# Patient Record
Sex: Male | Born: 1985 | Race: White | Hispanic: No | Marital: Married | Smoking: Never smoker
Health system: Southern US, Community
[De-identification: ages and names within clinical notes are randomized; demographics above are authoritative.]

## PROBLEM LIST (undated history)

## (undated) ENCOUNTER — Emergency Department (HOSPITAL_COMMUNITY): Discharge status: Against Medical Advice | Payer: 59

## (undated) DIAGNOSIS — I10 Essential (primary) hypertension: Secondary | ICD-10-CM

## (undated) DIAGNOSIS — J302 Other seasonal allergic rhinitis: Secondary | ICD-10-CM

## (undated) DIAGNOSIS — IMO0002 Reserved for concepts with insufficient information to code with codable children: Secondary | ICD-10-CM

## (undated) DIAGNOSIS — M329 Systemic lupus erythematosus, unspecified: Secondary | ICD-10-CM

## (undated) DIAGNOSIS — M1990 Unspecified osteoarthritis, unspecified site: Secondary | ICD-10-CM

## (undated) HISTORY — DX: Other seasonal allergic rhinitis: J30.2

## (undated) HISTORY — PX: MOUTH SURGERY: SHX715

## (undated) HISTORY — DX: Reserved for concepts with insufficient information to code with codable children: IMO0002

## (undated) HISTORY — DX: Systemic lupus erythematosus, unspecified: M32.9

---

## 2008-08-12 DIAGNOSIS — D239 Other benign neoplasm of skin, unspecified: Secondary | ICD-10-CM

## 2008-08-12 HISTORY — DX: Other benign neoplasm of skin, unspecified: D23.9

## 2010-07-08 ENCOUNTER — Encounter: Payer: Self-pay | Admitting: Sports Medicine

## 2010-07-14 ENCOUNTER — Ambulatory Visit: Payer: Self-pay | Admitting: Sports Medicine

## 2010-07-14 ENCOUNTER — Encounter (INDEPENDENT_AMBULATORY_CARE_PROVIDER_SITE_OTHER): Payer: Self-pay | Admitting: *Deleted

## 2010-07-14 DIAGNOSIS — M25569 Pain in unspecified knee: Secondary | ICD-10-CM | POA: Insufficient documentation

## 2010-08-16 ENCOUNTER — Ambulatory Visit: Payer: Self-pay | Admitting: Sports Medicine

## 2010-08-16 DIAGNOSIS — M2140 Flat foot [pes planus] (acquired), unspecified foot: Secondary | ICD-10-CM | POA: Insufficient documentation

## 2010-08-16 DIAGNOSIS — R269 Unspecified abnormalities of gait and mobility: Secondary | ICD-10-CM | POA: Insufficient documentation

## 2010-09-16 ENCOUNTER — Ambulatory Visit: Payer: Self-pay | Admitting: Sports Medicine

## 2010-09-16 ENCOUNTER — Ambulatory Visit: Payer: Self-pay | Admitting: Family Medicine

## 2010-09-16 LAB — CONVERTED CEMR LAB
Anti Nuclear Antibody(ANA): NEGATIVE
Platelets: 221 10*3/uL (ref 150–400)
Rhuematoid fact SerPl-aCnc: <20 intl units/mL (ref 0–20)
WBC: 5.9 10*3/uL (ref 4.0–10.5)

## 2010-09-19 ENCOUNTER — Encounter: Payer: Self-pay | Admitting: Sports Medicine

## 2010-10-14 ENCOUNTER — Ambulatory Visit: Payer: Self-pay | Admitting: Sports Medicine

## 2010-10-14 DIAGNOSIS — G90529 Complex regional pain syndrome I of unspecified lower limb: Secondary | ICD-10-CM | POA: Insufficient documentation

## 2010-12-14 NOTE — Assessment & Plan Note (Signed)
Summary: F/U [facility]   Vital Signs:  Patient profile:   25 year old male BP sitting:   108 / 71  Vitals Entered By: Lillia Pauls CMA (September 16, 2010 9:51 AM)  History of Present Illness: 24yo to office for f/u of knee pain & hamstring pain. Knees about 60% worse - recent vacation 2-weeks ago & was doing some increased walking about  1-3 miles a day.  By end of vacation pain so severe that could not walk & was using a wheelchair. Knees very painful to light touch. Was having swelling around patellar tendon and throughout whole knee. Significant improvement in swelling with bodyhelix strap. Denies any mechanical symptoms, denies instability. Denies any night-time pain, but is having pain at rest. Using tylenol as needed without much improvement. Still using NTG patches 1/4 patch on each knee - started 63-months ago, but does not seem to be helping. Trying knee exercises, unable to do wall squats & leg lifts with weight b/c too painful. No longer having any hamstring pain. No previous blood work done for arthritis work-up. Denies hx of tick bite.  Allergies: No Known Drug Allergies  Past History:  Past Medical History: Last updated: 07/14/2010 denies  Past Surgical History: Last updated: 07/14/2010 denies  Family History: Last updated: 07/14/2010 Family History High cholesterol Family History Hypertension  Social History: Last updated: 07/14/2010 Occupation:Commercial realtor Single Alcohol use-no Drug use-no Never Smoked  Review of Systems      See HPI  Physical Exam  General:  Well-developed,well-nourished,in no acute distress; alert,appropriate and cooperative throughout examination Msk:  KNEES:  NTG patch in place over PT tendons b/l.  Mild amount of soft tissue swelling noted around PTs b/l, no effusion.  Full ROM b/l, increased pain with terminal flexion. Extremely TTP over PT b/l, minimal TTP over inferior pole of patella & tibial tubercle b/l.  Seems  to be hypersensitive over PT, even with light touch. No joint line tenderness. No erythema or warmth. No ligamentous instability. Mild quad atrophy bilaterally.  HIP:  pelvis level, good strength b/l  FEET:  No gross leg length discrepancy.  Mild b/l pes planus, no sig transverse arch breakdown; RT foot shows shift at midfoot into significant pronation and ext rotation Pulses:  +2/4 DP & PT b/l Neurologic:  sensation intact to light touch.   Additional Exam:  MSK U/S - B/L Knees - normal quad tendon b/l, no effusion in suprapatellar pouch b/l.  B/l calcification & mild swelling of infrapatellar bursae b/l, unchanged from previous u/s 07/14/10.  Relatively normal appearing PT b/l, no signs of significant thickening, no signs of tear.  No increased doppler flow.  Images saved.   Impression & Recommendations:  Problem # 1:  KNEE PAIN, BILATERAL (ICD-719.46) Assessment Deteriorated - Not improving as expected, no significant findings of tendinopathy on MSK U/S and u/s relatively unchanged when compared to previous scan 07/14/10.  Since not improving, wonder if may have rheumatologic process vs RSD/complex regional pain syndrome.   - Will check CBC, ESR, ANA, RF - will contact him with results - D/c NTG patches b/c not helping - Start amitriptyline q hs, explained how to take medicaiton & common side effects - Cont to use body helix knee straps  - cont to remain as active as possible - f/u 20-month for re-evaluation  ADDENDUM: Blood work reviewed, normal CBC, ESR, ANA, RF.  Patient notified of normal results.  Suspect symptoms due to RSD/complex regional pain syndrome.  Cont with amitriptyline as  stated above.  f/u 5-month, encouraged to call with questions or concerns.  Complete Medication List: 1)  Amitriptyline Hcl 25 Mg Tabs (Amitriptyline hcl) .... Take 1 tab by mouth daily at bedtime  Patient Instructions: 1)  Stop using nitroglycerin patches. 2)  Start taking amitriptyline 1/2 tab at  bedtime, then increase to full tab in 1-week if not having side effects. 3)  You will get blood work done to look for any signs of arthritis.  We will contact you with results.   4)  Cont. to use knee straps. 5)  Follow up in 4-weeks. Prescriptions: AMITRIPTYLINE HCL 25 MG TABS (AMITRIPTYLINE HCL) take 1 tab by mouth daily at bedtime  #30 x 3   Entered and Authorized by:   Darene Lamer MD   Signed by:   Darene Lamer MD on 09/16/2010   Method used:   Print then Give to Patient   RxID:   [id number]    Orders Added: 1)  Est. Patient Level IV [56213]

## 2010-12-14 NOTE — Assessment & Plan Note (Signed)
Summary: F/U[facility]   Vital Signs:  Patient profile:   25 year old male Pulse rate:   76 / minute BP sitting:   105 / 71  (left arm)  Vitals Entered By: Rochele Pages RN (August 16, 2010 9:31 AM) CC: f/u bilat knee pain- hamstring pain   CC:  f/u bilat knee pain- hamstring pain.  History of Present Illness: Patient is here for follow up BL knee pain Hamstring: Pain much improved with patella banding. Will wear band with exercise but otherwise doing much better. Knee Pain: No longer with rest and normal to limited daily activities. However will experience pain on or near patella tendon insertion on tibea with running or running up stairs.  Notes some knee stiffness when standing after prolonged sitting.  Using nitropatches as directed.   He seems pleased with this much progress in short time.  Wants to get back to easy walking and maybe gradually some running.  Current Problems (verified): 1)  Pes Planus  (ICD-734) 2)  Knee Pain, Bilateral  (ICD-719.46)  Current Medications (verified): 1)  Nitroglycerin 0.2 Mg/hr Pt24 (Nitroglycerin) .... 1/4 Patch Daily On Each Knee  Allergies (verified): No Known Drug Allergies  Past History:  Past Medical History: Last updated: 07/14/2010 denies  Social History: Last updated: 07/14/2010 Occupation:Commercial realtor Single Alcohol use-no Drug use-no Never Smoked  Review of Systems  The patient denies anorexia, fever, weight gain, chest pain, syncope, and abdominal pain.    Physical Exam  General:  VS noted. Well young man in NAD Msk:  Knee: Inspection with no erythema or effusion. Palpation shows mild TTP over patellar tendon insertion on tibial tubercle b/l (R>L).  No warmth or joint line tenderness or condyle tenderness. Note this is much less than last visit  ROM normal in flexion and extension and lower leg rotation. Ligaments with solid consistent endpoints including ACL, PCL, LCL, MCL. Negative Mcmurray's and  provocative meniscal tests. Minimally painful patellar compression b/l, no apprehension. Quadriceps tendons unremarkable. Moderate L quad atrophy compared to R, but somewhat atrophied on R as well.  No gross leg length discrepancy.  Gait - does have some outtoeing of R foot with walking and running.  Spine - mild scoliosis with mild hypertrophy and L lower paraspinal muscles compared to R  Feet - mild b/l pes planus, no sig transverse arch breakdown; RT foot shows shift at midfoot into significant pronation and ext rotation this is present standing, walking and running  Hip - decent hip abd strength b/l    Impression & Recommendations:  Problem # 1:  KNEE PAIN, BILATERAL (ICD-719.46) Assessment Improved Improved with nitro patches, however still symptoms with running etc.  Pt meeting expectation for recovery given degree of inflimation noted at first visit.  Hamstring pain resolved.  Note some pain at patellar tendon insertion on tibia.  Think still some bursitis.  Plan to start phase 1 knee exercises.  Sheets and precautions given.  Pt understands plan. RTC 1 month, will re-scan then.  Problem # 2:  ABNORMALITY OF GAIT (ICD-781.2) Assessment: New  Right foot with out-toeing and flattened arch. This issue likely root of above issues.  Placed scaphoid arch support on insole orthotic on right foot.  In-toeing mildly improved with gait analysis after arch support.  Given instructions for inline walking and pigeon toe walking exercises. Will follow up in 1 month.    Orders: [facility] [phone])  Complete Medication List: 1)  Nitroglycerin 0.2 Mg/hr Pt24 (Nitroglycerin) .... 1/4 patch daily  on each knee

## 2010-12-14 NOTE — Consult Note (Signed)
Summary: [facility]   Imported By: Marily Memos 07/09/2010 11:09:07  _____________________________________________________________________  External Attachment:    Type:   Image     Comment:   External Document

## 2010-12-14 NOTE — Assessment & Plan Note (Signed)
Summary: NP BILATERAL KNEE PAIN/MJD   Vital Signs:  Patient profile:   25 year old male Height:      71 inches Weight:      155 pounds BMI:     21.70 BP sitting:   118 / 81  Vitals Entered By: Lillia Pauls CMA (July 14, 2010 8:43 AM)  History of Present Illness: 25 yo M new patient referred by PCP Marisue Ivan. Here for  ~ 2 years of b/l knee pain. In 1/10, did 41 mile run.  Had subsequent [place] of knees that showed "tendinopathy" of L knee and tendinosis of R knee.  Was told had calcification of L patellar tendon and had CSI in L knee 3-4 weeks after his run. Seeing Riley Lam and Solomon at [place] previously. Had PRP by [facility] in both patellar tendons, thinks he had 24 injectios into each knee!  Also had calcifications scraped off.  Tried PT for awhile, which made things worse. Was on mobic for long time, but recently stopped for concerns of GI/kidney issues. Started on flector patches, helped some, but is now out. MRI in 11/11 which showed "nothing to do." At this point, can only walk 1-1.5 miles without his knees killing him and also gets effusions.  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  When he gets pain, located anterior over patellar tendon and on medial aspect on hamstrings.  L worse than R.  No surgery as of yet. No instability.  Really no catching/locking.  Does have feeling of "tendon getting hard" and then he can't bend it. Pain with walking down stairs in the AM.  Pain with prolonged sitting on airplane and getting up.  Past History:  Past Medical History: denies  Past Surgical History: denies  Family History: Family History High cholesterol Family History Hypertension  Social History: Occupation:Commercial realtor Single Alcohol use-no Drug use-no Never Smoked Occupation:  employed Drug Use:  no Smoking Status:  never  Review of Systems  The patient denies anorexia, fever, weight loss, weight gain, vision loss, decreased  hearing, hoarseness, chest pain, syncope, dyspnea on exertion, peripheral edema, prolonged cough, headaches, hemoptysis, abdominal pain, melena, hematochezia, severe indigestion/heartburn, hematuria, incontinence, genital sores, muscle weakness, suspicious skin lesions, transient blindness, depression, unusual weight change, abnormal bleeding, enlarged lymph nodes, angioedema, breast masses, and testicular masses.    Physical Exam  General:  Well-developed,well-nourished,in no acute distress; alert,appropriate and cooperative throughout examination Head:  normocephalic and atraumatic.   Eyes:  vision grossly intact.   Neck:  supple.   Chest Wall:  no deformities.   Lungs:  normal respiratory effort.   Abdomen:  soft.   Msk:  Knee: Inspection with no erythema or effusion, does have b/l recurvatum L>R (5deg vs 3 deg) Palpation shows significant TTP over patellar tendon insertion on tibial tubercle b/l (L>R) and TTP b/l on distal medial HS. No warmth or joint line tenderness or condyle tenderness. ROM normal in flexion and extension and lower leg rotation. Ligaments with solid consistent endpoints including ACL, PCL, LCL, MCL. Negative Mcmurray's and provocative meniscal tests. Minimally painful patellar compression b/l, no apprehension. Quadriceps tendons unremarkable. Moderate L quad atrophy compared to R, but somewhat atrophied on R as well.  No gross leg length discrepancy.  Gait - does have some outtoeing of R foot with walking and running.  Spine - mild scoliosis with mild hypertrophy and L lower paraspinal muscles compared to R  Feet - mild b/l pes planus, no sig transverse arch breakdown  Hip - decent hip abd strength b/l   Extremities:  No clubbing, cyanosis, edema, or deformity noted with normal full range of motion of all joints.   Neurologic:  alert & oriented X3.     Knee Exam  Knee Exam:    Limited b/l knee MSK [place] - essentially nl b/l quad and patellar tendons.  Does  have b/l calcification and mild swelling of b/l infrapatellar bursae, which appears to be most tender point on his exam.  Doppler reveals minimal blood flow. Limited HS [place] - no e/o HS tear or tendinopathy   Impression & Recommendations:  Problem # 1:  KNEE PAIN, BILATERAL (ICD-719.46) Assessment Unchanged  At this point, most likely appears to have chronic b/l infrapatellar calcification and bursitis, may also have some component of chronic enthesiopathy of patellar tendon at insertion onto tibial tubercle.  Most likely his HS pain is hypersensitivity from decreased use over last 1-2 years and causes pain with excessive activity. - Will start NG patch protocol (1/4 patch daily) on b/l knees over distal patellar tendon - placed in b/l slingshot patellar tendon straps - resume quad strengthening exercises with SLR and quad sets - start light aerobic activity to start refiring of his HS (5-10 min of walking forward and back, or stationary bike) - f/u 1 month  Orders: [place] LIMITED ([id number]) Patella / Knee brace ([id number])  Complete Medication List: 1)  Nitroglycerin 0.2 Mg/hr Pt24 (Nitroglycerin) .... 1/4 patch daily on each knee Prescriptions: NITROGLYCERIN 0.2 MG/HR PT24 (NITROGLYCERIN) 1/4 patch daily on each knee  #45 x 0   Entered and Authorized by:   Corbin Ade MD   Signed by:   Corbin Ade MD on 07/14/2010   Method used:   Electronically to        [facility] [phone]* (retail)       [address] [place], [place]  [address]       Ph: [phone] or [phone]       Fax: [phone]   RxID:   [id number]

## 2010-12-14 NOTE — Assessment & Plan Note (Signed)
Summary: F/U [facility]   Vital Signs:  Patient profile:   25 year old male BP sitting:   114 / 79  Vitals Entered By: Lillia Pauls CMA (October 14, 2010 8:55 AM)  History of Present Illness: Knees feel about 90% better once or twice weekly feels some sharp pain but no resting pain no probs taking amitriptyline at night stopped NTG patches without any rebound of pain  during past month though was sick for 3 wks w pneumonia for this reason has not been that active  Allergies: No Known Drug Allergies  Physical Exam  General:  Well-developed,well-nourished,in no acute distress; alert,appropriate and cooperative throughout examination Msk:  knee exam shows no effusion; stable ligaments; negative Mcmurray's and provocative meniscal tests; non painful patellar compression; patellar and quadriceps tendons unremarkable.  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 Take 1 tab by mouth daily at bedtime  Patient Instructions: 1)  exercises 2)  straight leg raises 1 set of 15 3)  wall sits 5 x 30 secs 4)  walk as desired 5)  no vigorous activity 6)  keep on amitriptyline 7)  check in 2 months   Orders Added: 1)  Est. Patient Level III [51884]

## 2010-12-14 NOTE — Letter (Signed)
Summary: *Consult Note  [facility]  [address]   [place], [place] [address]   Phone: [phone]  Fax: [phone]    Re:    Eric Vance DOB:    12/11/1985   Dear: Dr. Burnadette Pop   Thank you for requesting that we see the above patient for consultation.  A copy of the detailed office note will be sent under separate cover, for your review.  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  Our recommendation is for:  nitroglycerine patches, b/l slingshot patellar tendon straps, quad strengthening exercises, and light aerobic activity.  He will f/u with [place] in 1 month.   New Orders include:  1)  Consultation Level II [99242] 2)  [place] LIMITED [[id number]] 3)  Patella / Knee brace [L2795]   New Medications started today include:  1)  NITROGLYCERIN 0.2 MG/HR PT24 (NITROGLYCERIN) 1/4 patch daily on each knee   After today's visit, the patients current medications include: 1)  NITROGLYCERIN 0.2 MG/HR PT24 (NITROGLYCERIN) 1/4 patch daily on each knee   Thank you for this consultation.  If you have any further questions regarding the care of this patient, please do not hesitate to contact me @ [phone]  Thank you for this opportunity to look after your patient.  Hope to see you soon!  Sincerely,   Corbin Ade MD Sports Medicine Fellow  Roanna Epley MD

## 2010-12-14 NOTE — Letter (Signed)
Summary: *Consult Note  [facility]  [address]   [place], [place] [address]   Phone: [phone]  Fax: [phone]    Re:    EMRAH ARIOLA DOB:    1986-08-22   Dear Dr. Burnadette Pop:    We had the pleasure of seeing your patient Chibuikem Thang in follow-up for bilateral knee pain.  A copy of the detailed office note will be sent under separate cover, for your review.  Evaluation today is consistent with:  1) KNEE PAIN, BILATERAL (ICD-719.46)  Knee pain is failing to improve as expected.  We previously thought pain was related to chronic infrapatellar bursitis with possible patellar enthesiopathy.  We decided to check blood work to evaluate for rheumatologic process, but all blood work was normal.  With normal labs, unchanged musculoskeleteal ultrasound, and persistent pain with hypersensitivity of knees we suspect RSD (reflex sympathetic dystrophy)/Complex regional pain syndrome as cause for his pain.  Our recommendation is for:  1) Blood work with CBC, ESR, ANA, RF - all were normal 2) Discontinue nitroglycerin patches 3) Start amitriptyline to help with symptoms 4) Continue knee straps 5) Follow-up with [place] in 14-month for re-evaluation.  New Orders include:  1)  Est. Patient Level IV [99214]  New Medications started today include:  1)  AMITRIPTYLINE HCL 25 MG TABS (AMITRIPTYLINE HCL) take 1 tab by mouth daily at bedtime  After today's visit, the patients current medications include: 1)  AMITRIPTYLINE HCL 25 MG TABS (AMITRIPTYLINE HCL) take 1 tab by mouth daily at bedtime  Thank you for allowing [place] to participate in the care of your patient.  If you have any further questions regarding the care of this patient, please do not hesitate to contact me @ [phone].  Thank you for this opportunity to look after your patient.  Sincerely,   Darene Lamer DO Sports Medicine Fellow  Roanna Epley, MD

## 2010-12-30 ENCOUNTER — Ambulatory Visit (INDEPENDENT_AMBULATORY_CARE_PROVIDER_SITE_OTHER): Payer: 59 | Admitting: Sports Medicine

## 2010-12-30 ENCOUNTER — Encounter: Payer: Self-pay | Admitting: Sports Medicine

## 2010-12-30 DIAGNOSIS — G90529 Complex regional pain syndrome I of unspecified lower limb: Secondary | ICD-10-CM

## 2011-01-05 NOTE — Assessment & Plan Note (Signed)
Summary: F/U FOR INJURY BOTH KNEES/LP   Vital Signs:  Patient profile:   25 year old male BP sitting:   97 / 85  Vitals Entered By: Lillia Pauls CMA (December 30, 2010 12:07 PM)  History of Present Illness: FU Bilat knee pain 2/2 RSD  Still gets knee sxs if he does too much also got worse sxs when he forgot to take the meds w him on a trip biggest side effect is some grogginess or fogginess in AM and sometimes through day Pain in day - none w usual activity or no more than 2/10 walked up steep hill though and in 100 yds had swelling pain - pain 5/10  able to do work activities w minimal problems   Allergies: No Known Drug Allergies  Physical Exam  General:  Well-developed,well-nourished,in no acute distress; alert,appropriate and cooperative throughout examination Msk:  Bilateral knee exam shows no effusion; stable ligaments; negative Mcmurray's and provocative meniscal tests; non painful patellar compression; patellar and quadriceps tendons unremarkable.  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 2 by mouth at bedtime as directed 2)  Tramadol Hcl 50 Mg Tabs (Tramadol hcl) .Marland Kitchen.. 1 by mouth bid  Patient Instructions: 1)  Tramadol 2)  start taking 1 in morning and one about 4 to 6PM 3)  side effects sometimes dizziness at first 4)  rare nausea 5)  late at night - can wake you up 6)  amitriptyline 7)  wait until you have had 3 days of tramadol 8)  then try 2 at night 9)  monitor sxs 10)  If workign - see me in 6 wks Prescriptions: AMITRIPTYLINE HCL 25 MG TABS (AMITRIPTYLINE HCL) 2 by mouth at bedtime as  directed  #60 x 5   Entered and Authorized by:   Enid Baas MD   Signed by:   Enid Baas MD on 12/30/2010   Method used:   [facility] to        [facility] [phone]* (retail)       [address] [place], [place]  [address]       Ph: [phone] or [phone]       Fax: [phone]   RxID:   [id number] TRAMADOL HCL 50 MG TABS (TRAMADOL HCL) 1 by mouth bid  #60 x 3   Entered and Authorized by:   Enid Baas MD   Signed by:   Enid Baas MD on 12/30/2010   Method used:   [facility]ron[facility] to        [facility] [phone]* (retail)       [address] [place], [place]  [address]       Ph: [phone] or [phone]       Fax: [phone]   RxID:   [id number]    Orders Added: 1)  Est. Patient Level III [28315]

## 2011-08-04 ENCOUNTER — Other Ambulatory Visit: Payer: Self-pay | Admitting: *Deleted

## 2011-08-04 MED ORDER — AMITRIPTYLINE HCL 25 MG PO TABS: ORAL_TABLET | ORAL | Status: DC

## 2011-10-19 ENCOUNTER — Other Ambulatory Visit: Payer: Self-pay | Admitting: *Deleted

## 2011-10-19 MED ORDER — AMITRIPTYLINE HCL 25 MG PO TABS: ORAL_TABLET | ORAL | Status: DC

## 2011-10-25 ENCOUNTER — Encounter: Payer: Self-pay | Admitting: Sports Medicine

## 2011-10-25 ENCOUNTER — Ambulatory Visit (INDEPENDENT_AMBULATORY_CARE_PROVIDER_SITE_OTHER): Payer: 59 | Admitting: Sports Medicine

## 2011-10-25 VITALS — BP 110/60

## 2011-10-25 DIAGNOSIS — M25569 Pain in unspecified knee: Secondary | ICD-10-CM

## 2011-10-25 DIAGNOSIS — G90529 Complex regional pain syndrome I of unspecified lower limb: Secondary | ICD-10-CM

## 2011-10-25 MED ORDER — COLCHICINE 0.6 MG PO TABS: 0.6000 mg | ORAL_TABLET | Freq: Two times a day (BID) | ORAL | Status: DC

## 2011-10-25 MED ORDER — AMITRIPTYLINE HCL 25 MG PO TABS: 50.0000 mg | ORAL_TABLET | Freq: Every day | ORAL | Status: DC

## 2011-10-25 NOTE — Patient Instructions (Signed)
We will trial you on colchicine for one month. This may help with your knee symptoms.

## 2011-10-25 NOTE — Assessment & Plan Note (Addendum)
Refilled amitriptyline Trial of colchicine. F/u in one month  Do not wean amitriptyline

## 2011-10-25 NOTE — Progress Notes (Signed)
  Subjective:    Patient ID: Eric Vance, male    DOB: 18-Dec-1985, 25 y.o.   MRN: [id number]  HPI Eric Vance returns for f/u He was originally tx at [facility] Tendon injury to left patellar tendon p running marathon Dr [place]ty Court did multiple injections of PRP These did help pain initially but his never got better and gradually developed easy flares with activity Developed more stiffness  We treated him initially with NTG and some lessening of pain Patellar straps helped  However pain continued to flare and stiffness worse even w light activity Finally given tramadol and amitriptyline aitriptyline and tramadol resolved 90% of pain for 6 wks  Now continues to have minimal pain as long as he stays on amitriptyline However, if he is too active will have pain next day Left knee is stiffer w rain and for 1 to 2 days after activity  Activity more than 2 miles walking makes lt knee painful, warm and red   Review of Systems     Objective:   Physical Exam  NAD  Knee: Normal to inspection with no erythema or effusion or obvious bony abnormalities. Palpation normal with no warmth or joint line tenderness or patellar tenderness or condyle tenderness. ROM normal in flexion and extension and lower leg rotation. Ligaments with solid consistent endpoints including ACL, PCL, LCL, MCL. Negative Mcmurray's and provocative meniscal tests. Non painful patellar compression. Patellar and quadriceps tendons unremarkable. Hamstring and quadriceps strength is normal.      Assessment & Plan:

## 2011-10-25 NOTE — Assessment & Plan Note (Signed)
In case there is a chance of CPPD or other crystalline issue (hx of calcification noted at [facility]) we will give a trial of colchicine to see if that lessens the tendency toward inflammatory change w walking

## 2011-11-30 ENCOUNTER — Ambulatory Visit: Payer: 59 | Admitting: Sports Medicine

## 2011-12-21 ENCOUNTER — Ambulatory Visit: Payer: 59 | Admitting: Sports Medicine

## 2012-04-12 DIAGNOSIS — S86819A Strain of other muscle(s) and tendon(s) at lower leg level, unspecified leg, initial encounter: Secondary | ICD-10-CM | POA: Insufficient documentation

## 2012-06-13 ENCOUNTER — Encounter: Payer: Self-pay | Admitting: Family Medicine

## 2012-06-13 DIAGNOSIS — M7651 Patellar tendinitis, right knee: Secondary | ICD-10-CM | POA: Insufficient documentation

## 2014-08-07 DIAGNOSIS — M25561 Pain in right knee: Secondary | ICD-10-CM | POA: Insufficient documentation

## 2014-08-12 DIAGNOSIS — E559 Vitamin D deficiency, unspecified: Secondary | ICD-10-CM | POA: Insufficient documentation

## 2015-09-01 ENCOUNTER — Other Ambulatory Visit: Payer: Self-pay | Admitting: Orthopedic Surgery

## 2015-09-01 DIAGNOSIS — M79644 Pain in right finger(s): Secondary | ICD-10-CM

## 2015-09-14 ENCOUNTER — Ambulatory Visit
Admission: RE | Admit: 2015-09-14 | Discharge: 2015-09-14 | Disposition: A | Discharge status: Home / Self Care | Payer: BLUE CROSS/BLUE SHIELD | Source: Ambulatory Visit | Attending: Orthopedic Surgery | Admitting: Orthopedic Surgery

## 2015-09-14 DIAGNOSIS — M79644 Pain in right finger(s): Secondary | ICD-10-CM

## 2015-09-14 MED ORDER — IOHEXOL 180 MG/ML  SOLN
1.0000 mL | Freq: Once | INTRAMUSCULAR | Status: DC | PRN
  Administered 2015-09-14: 1 mL via INTRA_ARTICULAR

## 2015-11-27 DIAGNOSIS — S63410A Traumatic rupture of collateral ligament of right index finger at metacarpophalangeal and interphalangeal joint, initial encounter: Secondary | ICD-10-CM | POA: Insufficient documentation

## 2016-03-15 ENCOUNTER — Other Ambulatory Visit: Payer: Self-pay | Admitting: Family Medicine

## 2016-03-15 DIAGNOSIS — R51 Headache: Principal | ICD-10-CM

## 2016-03-15 DIAGNOSIS — R519 Headache, unspecified: Secondary | ICD-10-CM

## 2016-03-21 ENCOUNTER — Ambulatory Visit: Payer: BLUE CROSS/BLUE SHIELD

## 2016-06-28 ENCOUNTER — Other Ambulatory Visit: Payer: Self-pay | Admitting: Orthopedic Surgery

## 2016-06-28 DIAGNOSIS — M79641 Pain in right hand: Secondary | ICD-10-CM

## 2016-07-06 ENCOUNTER — Other Ambulatory Visit: Payer: BLUE CROSS/BLUE SHIELD

## 2017-08-02 ENCOUNTER — Other Ambulatory Visit: Payer: Self-pay | Admitting: Internal Medicine

## 2017-08-02 ENCOUNTER — Ambulatory Visit
Admission: RE | Admit: 2017-08-02 | Discharge: 2017-08-02 | Disposition: A | Discharge status: Home / Self Care | Payer: BLUE CROSS/BLUE SHIELD | Source: Ambulatory Visit | Attending: Internal Medicine | Admitting: Internal Medicine

## 2017-08-02 DIAGNOSIS — N62 Hypertrophy of breast: Secondary | ICD-10-CM | POA: Insufficient documentation

## 2017-08-02 DIAGNOSIS — R0789 Other chest pain: Secondary | ICD-10-CM

## 2017-08-02 DIAGNOSIS — R091 Pleurisy: Secondary | ICD-10-CM | POA: Diagnosis not present

## 2017-08-02 MED ORDER — IOPAMIDOL (ISOVUE-370) INJECTION 76%
100.0000 mL | Freq: Once | INTRAVENOUS | Status: AC | PRN
  Administered 2017-08-02: 100 mL via INTRAVENOUS

## 2018-01-31 DIAGNOSIS — M25541 Pain in joints of right hand: Secondary | ICD-10-CM | POA: Insufficient documentation

## 2018-04-06 ENCOUNTER — Encounter: Payer: Self-pay | Admitting: Urology

## 2018-04-06 ENCOUNTER — Ambulatory Visit (INDEPENDENT_AMBULATORY_CARE_PROVIDER_SITE_OTHER): Payer: BLUE CROSS/BLUE SHIELD | Admitting: Urology

## 2018-04-06 VITALS — BP 123/79 | HR 83 | Ht 70.0 in | Wt 157.2 lb

## 2018-04-06 DIAGNOSIS — N5089 Other specified disorders of the male genital organs: Secondary | ICD-10-CM

## 2018-04-06 DIAGNOSIS — N509 Disorder of male genital organs, unspecified: Secondary | ICD-10-CM | POA: Diagnosis not present

## 2018-04-06 DIAGNOSIS — N5082 Scrotal pain: Secondary | ICD-10-CM

## 2018-04-06 MED ORDER — ETODOLAC 300 MG PO CAPS: 300.0000 mg | ORAL_CAPSULE | Freq: Three times a day (TID) | ORAL | 0 refills | Status: DC | PRN

## 2018-04-06 NOTE — Progress Notes (Signed)
04/06/2018 3:31 PM   Eric Vance 05/27/1986 [id number]  Referring provider: No referring provider defined for this encounter.  Chief Complaint  Patient presents with  . Testicle Pain    HPI: 32 year old male presents for evaluation of right hemiscrotal pain.  Approximately 2 days ago he had onset of right groin pain radiating to the right scrotum.  He had sharp pain which was intermittent every 5 to 10 minutes which lasted 10 to 15 hours.  His sharp pain has resolved and he complains of a dull throbbing pain with mild to moderate severity.  He has no voiding symptoms.  Denies fever or chills.  His pain is worse when supine and better when upright.  He has a history of a right spermatocele previously aspirated by Dr. Yves Dill several years ago.  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   PMH: Past Medical History:  Diagnosis Date  . Lupus ([place])   . Seasonal allergies     Surgical History: History reviewed. No pertinent surgical history.  Home Medications:  Allergies as of 04/06/2018   No Known Allergies     Medication List        Accurate as of 04/06/18  3:31 PM. Always use your most recent med list.          acetaminophen 650 MG CR tablet Commonly known as:  TYLENOL Take by mouth.   FLONASE SENSIMIST 27.5 MCG/SPRAY nasal spray Generic drug:  fluticasone Place into the nose.   levocetirizine 5 MG tablet Commonly known as:  XYZAL Take by mouth.   montelukast 10 MG tablet Commonly known as:  SINGULAIR Take by mouth.       Allergies: No Known Allergies  Family History: Family History  Problem Relation Age of Onset  . Hypertension Father     Social History:  reports that he has never smoked. He has never used smokeless tobacco. He reports that he drinks alcohol. He reports that he does not use drugs.  ROS: UROLOGY Frequent Urination?: No Hard to postpone urination?: No Burning/pain with urination?: No Get  up at night to urinate?: No Leakage of urine?: No Urine stream starts and stops?: No Trouble starting stream?: No Do you have to strain to urinate?: No Blood in urine?: No Urinary tract infection?: No Sexually transmitted disease?: No Injury to kidneys or bladder?: No Painful intercourse?: No Weak stream?: No Erection problems?: No Penile pain?: No  Gastrointestinal Nausea?: No Vomiting?: No Indigestion/heartburn?: No Diarrhea?: No Constipation?: No  Constitutional Fever: No Night sweats?: No Weight loss?: No Fatigue?: No  Skin Skin rash/lesions?: No Itching?: No  Eyes Blurred vision?: No Double vision?: No  Ears/Nose/Throat Sore throat?: No Sinus problems?: No  Hematologic/Lymphatic Swollen glands?: No Easy bruising?: No  Cardiovascular Leg swelling?: No Chest pain?: No  Respiratory Cough?: No Shortness of breath?: No  Endocrine Excessive thirst?: No  Musculoskeletal Back pain?: Yes Joint pain?: Yes  Neurological Headaches?: No Dizziness?: No  Psychologic Depression?: No Anxiety?: No  Physical Exam: BP 123/79 (BP Location: Left Arm, Patient Position: Sitting, Cuff Size: Normal)   Pulse 83   Ht 5\' 10"  (1.778 m)   Wt 157 lb 3.2 oz (71.3 kg)   SpO2 99%   BMI 22.56 kg/m   Constitutional:  Alert and oriented, No acute distress. HEENT: [place] AT, moist mucus membranes.  Trachea midline, no masses. Cardiovascular: No clubbing, cyanosis, or edema. Respiratory: Normal respiratory effort, no increased work of  breathing. GI: Abdomen is soft, nontender, nondistended, no abdominal masses GU: No CVA tenderness penis without lesions.. Testes descended bilaterally without masses or tenderness.  The 15 mm right spermatocele is present and just superior and lateral is an approximately 2 cm soft mass.  A definite hernia is not appreciated. Lymph: No cervical or inguinal lymphadenopathy. Skin: No rashes, bruises or suspicious lesions. Neurologic: Grossly  intact, no focal deficits, moving all 4 extremities. Psychiatric: Normal mood and affect.  Laboratory Data: Lab Results  Component Value Date   WBC 5.9 09/16/2010   HGB 14.5 09/16/2010   HCT 43.6 09/16/2010   MCV 94.6 09/16/2010   PLT 221 09/16/2010    Assessment & Plan:   32 year old male with right hemiscrotal pain.  His spermatocele is unchanged however there is a new mass palpable in the right hemiscrotum.  This may be a cord lipoma, cord hydrocele or possible hernia.  I recommended a scrotal sonogram for further evaluation.  Rx etodolac was sent to his pharmacy.    Abbie Sons, [place] [address] [place], [place] [address] [phone]

## 2018-04-10 ENCOUNTER — Encounter: Payer: Self-pay | Admitting: Urology

## 2018-04-11 ENCOUNTER — Ambulatory Visit: Payer: Self-pay | Admitting: Urology

## 2018-04-13 ENCOUNTER — Ambulatory Visit
Admission: RE | Admit: 2018-04-13 | Discharge: 2018-04-13 | Disposition: A | Discharge status: Home / Self Care | Payer: BLUE CROSS/BLUE SHIELD | Source: Ambulatory Visit | Attending: Urology | Admitting: Urology

## 2018-04-13 ENCOUNTER — Telehealth: Payer: Self-pay | Admitting: Urology

## 2018-04-13 DIAGNOSIS — N5089 Other specified disorders of the male genital organs: Secondary | ICD-10-CM

## 2018-04-13 DIAGNOSIS — N5082 Scrotal pain: Secondary | ICD-10-CM

## 2018-04-13 NOTE — Telephone Encounter (Signed)
I spoke with [facility] and they do want the [place] ordered with doppler so just keep doing it the way you are ordering it and it should all be good.   Thanks,  Sharyn Lull

## 2018-04-13 NOTE — Telephone Encounter (Signed)
Patient has decided to go to [facility] to have his Ultrasound done and since we had already scheduled it for [place] I will need a new order for the Scrotal and doppler put in please. [place] could not use the other due to it already being attached to BTO.  Thanks, Sharyn Lull

## 2018-04-13 NOTE — Telephone Encounter (Signed)
The order was entered.

## 2018-04-18 ENCOUNTER — Ambulatory Visit
Admission: RE | Admit: 2018-04-18 | Discharge: 2018-04-18 | Disposition: A | Discharge status: Home / Self Care | Payer: BLUE CROSS/BLUE SHIELD | Source: Ambulatory Visit | Attending: Urology | Admitting: Urology

## 2018-04-18 DIAGNOSIS — N5089 Other specified disorders of the male genital organs: Secondary | ICD-10-CM

## 2018-04-18 DIAGNOSIS — N5082 Scrotal pain: Secondary | ICD-10-CM

## 2018-04-20 ENCOUNTER — Encounter: Payer: Self-pay | Admitting: Urology

## 2018-04-24 ENCOUNTER — Telehealth: Payer: Self-pay | Admitting: Urology

## 2018-04-24 NOTE — Telephone Encounter (Signed)
Pt called office requesting his [place] results.  Please Advise. Thanks.

## 2018-04-25 NOTE — Telephone Encounter (Signed)
He was given ultrasound results.  Please schedule possible spermatocele aspiration.  This can be in a 15-minute slot

## 2018-06-06 ENCOUNTER — Encounter: Payer: Self-pay | Admitting: Urology

## 2018-06-06 ENCOUNTER — Ambulatory Visit: Payer: BLUE CROSS/BLUE SHIELD | Admitting: Urology

## 2018-06-06 VITALS — BP 116/79 | HR 106 | Ht 70.0 in | Wt 152.9 lb

## 2018-06-06 DIAGNOSIS — N4340 Spermatocele of epididymis, unspecified: Secondary | ICD-10-CM | POA: Diagnosis not present

## 2018-06-06 NOTE — Progress Notes (Signed)
Procedure note:  Diagnosis: Spermatocele with pain localized to this area  Procedure: Aspiration of spermatocele  Anesthesia: 1% plain Xylocaine  Description: The external genitalia were prepped and draped in the usual fashion.  The spermatocele was elevated to the anterior skin surface and sonogram was performed which showed an anechoic structure consistent with fluid consistency.  The overlying skin was anesthetized with 1 mL of 1% plain Xylocaine.  The cyst was punctured with a 20-gauge Angiocath and 15 mL of straw-colored fluid was aspirated with resolution of the cyst.  The second smaller spermatocele was aspirated in a similar fashion.  Complications: None  Recommendation: Should he have relatively rapid reaccumulation of the cyst fluid he is interested in spermatocelectomy.  He will return as needed.  Call back for redness, swelling or increased pain.

## 2018-06-07 ENCOUNTER — Encounter: Payer: Self-pay | Admitting: Urology

## 2019-05-02 ENCOUNTER — Other Ambulatory Visit: Payer: Self-pay

## 2019-05-02 ENCOUNTER — Other Ambulatory Visit: Payer: Self-pay | Admitting: Family Medicine

## 2019-05-02 ENCOUNTER — Ambulatory Visit
Admission: RE | Admit: 2019-05-02 | Discharge: 2019-05-02 | Disposition: A | Discharge status: Home / Self Care | Payer: BC Managed Care – PPO | Source: Ambulatory Visit | Attending: Family Medicine | Admitting: Family Medicine

## 2019-05-02 DIAGNOSIS — M5441 Lumbago with sciatica, right side: Secondary | ICD-10-CM | POA: Diagnosis present

## 2019-05-02 DIAGNOSIS — M5442 Lumbago with sciatica, left side: Secondary | ICD-10-CM | POA: Insufficient documentation

## 2019-05-03 ENCOUNTER — Ambulatory Visit: Payer: BLUE CROSS/BLUE SHIELD

## 2019-08-01 ENCOUNTER — Ambulatory Visit: Payer: BC Managed Care – PPO | Admitting: Urology

## 2019-08-01 ENCOUNTER — Other Ambulatory Visit: Payer: Self-pay

## 2019-08-01 ENCOUNTER — Encounter: Payer: Self-pay | Admitting: Urology

## 2019-08-01 VITALS — BP 114/74 | HR 75 | Ht 71.0 in | Wt 159.2 lb

## 2019-08-01 DIAGNOSIS — N528 Other male erectile dysfunction: Secondary | ICD-10-CM | POA: Diagnosis not present

## 2019-08-01 DIAGNOSIS — R7989 Other specified abnormal findings of blood chemistry: Secondary | ICD-10-CM | POA: Insufficient documentation

## 2019-08-01 DIAGNOSIS — N390 Urinary tract infection, site not specified: Secondary | ICD-10-CM

## 2019-08-01 DIAGNOSIS — F419 Anxiety disorder, unspecified: Secondary | ICD-10-CM | POA: Insufficient documentation

## 2019-08-01 DIAGNOSIS — Z8719 Personal history of other diseases of the digestive system: Secondary | ICD-10-CM | POA: Insufficient documentation

## 2019-08-01 DIAGNOSIS — R351 Nocturia: Secondary | ICD-10-CM | POA: Diagnosis not present

## 2019-08-01 DIAGNOSIS — Z87898 Personal history of other specified conditions: Secondary | ICD-10-CM | POA: Insufficient documentation

## 2019-08-01 LAB — MICROSCOPIC EXAMINATION
Bacteria, UA: NONE SEEN
Epithelial Cells (non renal): NONE SEEN /hpf (ref 0–10)
RBC: NONE SEEN /hpf (ref 0–2)

## 2019-08-01 LAB — URINALYSIS, COMPLETE
Bilirubin, UA: NEGATIVE
Glucose, UA: NEGATIVE
Ketones, UA: NEGATIVE
Leukocytes,UA: NEGATIVE
Nitrite, UA: NEGATIVE
Protein,UA: NEGATIVE
RBC, UA: NEGATIVE
Specific Gravity, UA: 1.02 (ref 1.005–1.030)
Urobilinogen, Ur: 0.2 mg/dL (ref 0.2–1.0)
pH, UA: 6 (ref 5.0–7.5)

## 2019-08-01 MED ORDER — SULFAMETHOXAZOLE-TRIMETHOPRIM 800-160 MG PO TABS: 1.0000 | ORAL_TABLET | Freq: Two times a day (BID) | ORAL | 0 refills | Status: AC

## 2019-08-01 NOTE — Progress Notes (Signed)
08/01/2019 9:30 AM   Eric Vance 15-Jun-1986 [id number]  Referring provider: Dion Body, MD [place] [facility] [place],  [place] [address]  Chief Complaint  Patient presents with  . Recurrent UTI    HPI: 33 y.o. male previously seen for a right spermatocele.  He was seen at [facility] on 05/02/2019 complaining of right low back pain that radiated down the right leg.  He was also complaining of urinary frequency and urgency.  An urgent MRI was recommended however urinalysis did show significant pyuria and urine culture was positive for E. coli.  He was treated with a 5-day course of Septra DS with improvement in his voiding symptoms.  He presented to a [facility] on 07/06/2019 complaining of dysuria, malodorous urine, burning at the head of the penis and feeling "foggy".  He had some increased fatigue and subjective fever.  Dipstick urinalysis showed 4+ leukocytes and urine culture again grew E. coli.  He was treated with a 10-day course of cefdinir.  He had negative test for chlamydia, gonorrhea and trichomonas.  He was seen back at [facility] on 8/24 stating his symptoms have not improved.  He was also complaining of nausea, headache.  His antibiotic was changed to a 7-day course of Cipro.  His symptoms resolved.  For the past year he has nocturia x3 with normal urine volumes.  He also complains of difficulty maintaining an erection since he has been diagnosed with recurrent UTI.   PMH: Past Medical History:  Diagnosis Date  . Lupus ([place])   . Seasonal allergies     Surgical History: No past surgical history on file.  Home Medications:  Allergies as of 08/01/2019   No Known Allergies     Medication List       Accurate as of August 01, 2019  9:30 AM. If you have any questions, ask your nurse or doctor.        acetaminophen 650 MG CR tablet Commonly known as: TYLENOL Take by mouth.   fexofenadine 180 MG tablet  Commonly known as: ALLEGRA Take by mouth.   Flonase Sensimist 27.5 MCG/SPRAY nasal spray Generic drug: fluticasone Place into the nose.   ketoconazole 2 % shampoo Commonly known as: NIZORAL   levocetirizine 5 MG tablet Commonly known as: XYZAL Take by mouth.   meloxicam 15 MG tablet Commonly known as: MOBIC   montelukast 10 MG tablet Commonly known as: SINGULAIR Take by mouth.   Olopatadine HCl 0.6 % Soln   traZODone 50 MG tablet Commonly known as: DESYREL Take by mouth.       Allergies: No Known Allergies  Family History: Family History  Problem Relation Age of Onset  . Hypertension Father     Social History:  reports that he has never smoked. He has never used smokeless tobacco. He reports current alcohol use. He reports that he does not use drugs.  ROS: UROLOGY Frequent Urination?: Yes Hard to postpone urination?: Yes Burning/pain with urination?: Yes Get up at night to urinate?: Yes Leakage of urine?: Yes Urine stream starts and stops?: No Trouble starting stream?: No Do you have to strain to urinate?: No Blood in urine?: No Urinary tract infection?: Yes Sexually transmitted disease?: No Injury to kidneys or bladder?: No Painful intercourse?: No Weak stream?: No Erection problems?: Yes Penile pain?: Yes  Gastrointestinal Nausea?: No Vomiting?: No Indigestion/heartburn?: No Diarrhea?: No Constipation?: No  Constitutional Fever: No Night sweats?: No Weight loss?: No Fatigue?: Yes  Skin Skin rash/lesions?: No Itching?: No  Eyes Blurred vision?: No Double vision?: No  Ears/Nose/Throat Sore throat?: No Sinus problems?: No  Hematologic/Lymphatic Swollen glands?: No Easy bruising?: No  Cardiovascular Leg swelling?: No Chest pain?: No  Respiratory Cough?: No Shortness of breath?: No  Endocrine Excessive thirst?: No  Musculoskeletal Back pain?: Yes Joint pain?: Yes  Neurological Headaches?: No Dizziness?: No   Psychologic Depression?: No Anxiety?: No  Physical Exam: BP 114/74 (BP Location: Left Arm, Patient Position: Sitting, Cuff Size: Normal)   Pulse 75   Ht 5\' 11"  (1.803 m)   Wt 159 lb 3.2 oz (72.2 kg)   BMI 22.20 kg/m   Constitutional:  Alert and oriented, No acute distress. HEENT: [place] AT, moist mucus membranes.  Trachea midline, no masses. Cardiovascular: No clubbing, cyanosis, or edema. Respiratory: Normal respiratory effort, no increased work of breathing.   Laboratory Data:  Urinalysis Dipstick/microscopy negative  Assessment & Plan:   33 YO male with recurrent UTI.  Most likely secondary to incompletely treated prostatitis.  He is at risk for chronic bacterial prostatitis.  Will treat with an additional 3-week course Septra DS.  Schedule screening renal/bladder ultrasound.  Follow-up 4-6 weeks for symptom reassessment.   Abbie Sons, [place] [address] [place], [place] [address] [phone]

## 2019-08-09 ENCOUNTER — Encounter: Payer: Self-pay | Admitting: Urology

## 2019-08-15 ENCOUNTER — Ambulatory Visit
Admission: RE | Admit: 2019-08-15 | Discharge: 2019-08-15 | Disposition: A | Discharge status: Home / Self Care | Payer: BC Managed Care – PPO | Source: Ambulatory Visit | Attending: Urology | Admitting: Urology

## 2019-08-15 DIAGNOSIS — N390 Urinary tract infection, site not specified: Secondary | ICD-10-CM

## 2019-08-16 ENCOUNTER — Telehealth: Payer: Self-pay

## 2019-08-16 NOTE — Telephone Encounter (Signed)
-----   Message from Abbie Sons, MD sent at 08/16/2019  7:40 AM EDT ----- Renal ultrasound showed no abnormalities.

## 2019-08-16 NOTE — Telephone Encounter (Signed)
Pt informed via mychart. See documentation.

## 2019-09-06 ENCOUNTER — Ambulatory Visit: Payer: BC Managed Care – PPO | Admitting: Urology

## 2019-09-18 DIAGNOSIS — G4709 Other insomnia: Secondary | ICD-10-CM | POA: Insufficient documentation

## 2019-12-06 ENCOUNTER — Other Ambulatory Visit: Payer: Self-pay

## 2019-12-06 ENCOUNTER — Encounter: Payer: Self-pay | Admitting: Urology

## 2019-12-06 ENCOUNTER — Ambulatory Visit: Payer: BC Managed Care – PPO | Admitting: Urology

## 2019-12-06 VITALS — BP 139/80 | HR 65 | Ht 71.0 in | Wt 153.0 lb

## 2019-12-06 DIAGNOSIS — R39198 Other difficulties with micturition: Secondary | ICD-10-CM

## 2019-12-06 DIAGNOSIS — R399 Unspecified symptoms and signs involving the genitourinary system: Secondary | ICD-10-CM

## 2019-12-06 DIAGNOSIS — N528 Other male erectile dysfunction: Secondary | ICD-10-CM | POA: Diagnosis not present

## 2019-12-06 LAB — BLADDER SCAN AMB NON-IMAGING

## 2019-12-06 MED ORDER — TADALAFIL 20 MG PO TABS: ORAL_TABLET | ORAL | 0 refills | Status: DC

## 2019-12-06 MED ORDER — DIAZEPAM 10 MG PO TABS: ORAL_TABLET | ORAL | 0 refills | Status: DC

## 2019-12-06 MED ORDER — TAMSULOSIN HCL 0.4 MG PO CAPS: 0.4000 mg | ORAL_CAPSULE | Freq: Every day | ORAL | 1 refills | Status: DC

## 2019-12-06 NOTE — Progress Notes (Signed)
12/06/2019 10:08 AM   Eric Vance Nov 11, 1986 [id number]  Referring provider: Dion Body, MD [facility] [place],  [place] [address]  Chief Complaint  Patient presents with  . Follow-up    HPI: 34 y.o. male who was treated for a UTI mid June 2020 he had symptoms of urinary frequency, urgency and pyuria.  Urine culture grew E. coli however he was only treated with a 5-day course of antibiotics.  He had recurrent symptoms late August 2020 with dysuria, burning of the head of the penis fatigue and subjective fever.  Urine culture again grew E. coli and he was treated with a 10-day course of cefdinir.  He also had a follow-up 7-day course of Cipro due to persistent symptoms after completing the cefdinir and had resolution of his symptoms.  A renal ultrasound was ordered which was unremarkable.  Since being diagnosed with UTI he has had some persistent voiding symptoms including sensation of incomplete emptying, frequency, intermittent urinary stream and nocturia x2.  He will also have urgency when going from laying/sitting to standing.  He also complains of difficulty achieving and maintaining an erection.  SHIM was 10/25 indicating moderate ED.  He also relates for intermittent symptoms though not as severe for the past 3-5 years.   PMH: Past Medical History:  Diagnosis Date  . Lupus ([place])   . Seasonal allergies     Surgical History: No past surgical history on file.  Home Medications:  Allergies as of 12/06/2019   No Known Allergies     Medication List       Accurate as of December 06, 2019 10:08 AM. If you have any questions, ask your nurse or doctor.        acetaminophen 650 MG CR tablet Commonly known as: TYLENOL Take by mouth.   fexofenadine 180 MG tablet Commonly known as: ALLEGRA Take by mouth.   Flonase Sensimist 27.5 MCG/SPRAY nasal spray Generic drug: fluticasone Place into the nose.   ketoconazole 2 % shampoo Commonly  known as: NIZORAL   levocetirizine 5 MG tablet Commonly known as: XYZAL Take by mouth.   meloxicam 15 MG tablet Commonly known as: MOBIC   montelukast 10 MG tablet Commonly known as: SINGULAIR Take by mouth.   Olopatadine HCl 0.6 % Soln   traZODone 50 MG tablet Commonly known as: DESYREL Take by mouth.       Allergies: No Known Allergies  Family History: Family History  Problem Relation Age of Onset  . Hypertension Father     Social History:  reports that he has never smoked. He has never used smokeless tobacco. He reports current alcohol use. He reports that he does not use drugs.  ROS: UROLOGY Frequent Urination?: No Hard to postpone urination?: No Burning/pain with urination?: No Get up at night to urinate?: Yes Leakage of urine?: Yes Urine stream starts and stops?: No Trouble starting stream?: No Do you have to strain to urinate?: Yes Blood in urine?: No Urinary tract infection?: No Sexually transmitted disease?: No Injury to kidneys or bladder?: No Painful intercourse?: No Weak stream?: No Erection problems?: Yes Penile pain?: No  Gastrointestinal Nausea?: No Vomiting?: No Indigestion/heartburn?: No Diarrhea?: No Constipation?: No  Constitutional Fever: No Night sweats?: No Weight loss?: No Fatigue?: Yes  Skin Skin rash/lesions?: No Itching?: No  Eyes Blurred vision?: No Double vision?: No  Ears/Nose/Throat Sore throat?: No Sinus problems?: No  Hematologic/Lymphatic Swollen glands?: No Easy bruising?: No  Cardiovascular Leg swelling?: No Chest  pain?: No  Respiratory Cough?: No Shortness of breath?: No  Endocrine Excessive thirst?: No  Musculoskeletal Back pain?: Yes Joint pain?: Yes  Neurological Headaches?: No Dizziness?: No  Psychologic Depression?: No Anxiety?: Yes  Physical Exam: BP 139/80 (BP Location: Left Arm, Patient Position: Sitting, Cuff Size: Normal)   Pulse 65   Ht 5\' 11"  (1.803 m)   Wt 153 lb  (69.4 kg)   BMI 21.34 kg/m   Constitutional:  Alert and oriented, No acute distress. HEENT: [place] AT, moist mucus membranes.  Trachea midline, no masses. Cardiovascular: No clubbing, cyanosis, or edema. Respiratory: Normal respiratory effort, no increased work of breathing. Skin: No rashes, bruises or suspicious lesions. Neurologic: Grossly intact, no focal deficits, moving all 4 extremities. Psychiatric: Normal mood and affect.   Pertinent Imaging:  Results for orders placed during the hospital encounter of 08/15/19  Ultrasound renal complete   Narrative CLINICAL DATA:  Initial evaluation for recurrent UTI.  EXAM: RENAL / URINARY TRACT ULTRASOUND COMPLETE  COMPARISON:  None.  FINDINGS: Right Kidney:  Renal measurements: 9.6 x 3.7 x 4.4 cm = volume: 82.4 mL . Echogenicity within normal limits. No mass or hydronephrosis visualized.  Left Kidney:  Renal measurements: 9.9 x 5.3 x 5.3 cm = volume: 145.3 mL. Echogenicity within normal limits. No mass or hydronephrosis visualized.  Bladder:  Appears normal for degree of bladder distention.  IMPRESSION: Normal renal ultrasound.   Electronically Signed   By: Jeannine Boga M.D.   On: 08/15/2019 22:03     Assessment & Plan:   34 y.o. male with recent positive urine cultures x2 with symptoms most likely representing an incompletely treated UTI.  He does have persistent storage and obstructive voiding symptoms.  PVR by bladder scan was 13 mL today however he was not asked to save the urine so urinalysis was not performed.  I recommended lower tract evaluation with cystoscopy to evaluate for the possibility of a urethral stricture.  Urinalysis will be checked prior to cystoscopy.  In the interim we will give a trial of tamsulosin 0.4 mg daily.  He was also interested in a trial of PDE 5 medication and Rx tadalafil was sent to pharmacy.  Valium 10 mg p.o. 30 minutes prior to cystoscopy.  He was informed he will need a  driver.   Abbie Sons, [place] [address],  [place], [place] [address] [phone]

## 2019-12-06 NOTE — Patient Instructions (Signed)

## 2019-12-08 ENCOUNTER — Encounter: Payer: Self-pay | Admitting: Urology

## 2020-01-06 ENCOUNTER — Encounter: Payer: Self-pay | Admitting: Urology

## 2020-01-06 DIAGNOSIS — G5792 Unspecified mononeuropathy of left lower limb: Secondary | ICD-10-CM | POA: Insufficient documentation

## 2020-01-08 ENCOUNTER — Other Ambulatory Visit: Payer: Self-pay

## 2020-01-08 ENCOUNTER — Ambulatory Visit (INDEPENDENT_AMBULATORY_CARE_PROVIDER_SITE_OTHER): Payer: BC Managed Care – PPO | Admitting: Urology

## 2020-01-08 ENCOUNTER — Encounter: Payer: Self-pay | Admitting: Urology

## 2020-01-08 VITALS — BP 112/75 | HR 93 | Ht 70.0 in | Wt 156.0 lb

## 2020-01-08 DIAGNOSIS — R399 Unspecified symptoms and signs involving the genitourinary system: Secondary | ICD-10-CM

## 2020-01-08 MED ORDER — LEVOFLOXACIN 500 MG PO TABS
500.0000 mg | ORAL_TABLET | Freq: Once | ORAL | Status: AC
  Administered 2020-01-08: 15:00:00 500 mg via ORAL

## 2020-01-08 NOTE — Progress Notes (Signed)
   01/08/20  CC:  Chief Complaint  Patient presents with  . Cysto   Indications: Refer to prior note 12/06/2019.    HPI: No significant change in voiding symptoms on tamsulosin.  He has recently had onset of neurologic issues including sciatica which is in the process of being evaluated.  Blood pressure 112/75, pulse 93, height 5\' 10"  (1.778 m), weight 156 lb (70.8 kg). NED. A&Ox3.    Cystoscopy Procedure Note  Patient identification was confirmed, informed consent was obtained, and patient was prepped using Betadine solution.  Lidocaine jelly was administered per urethral meatus.     Pre-Procedure: - Inspection reveals a normal caliber urethra meatus.  Procedure: The flexible cystoscope was introduced without difficulty - No urethral strictures/lesions are present. - Normal prostate  - Normal bladder neck - Bilateral ureteral orifices identified - Bladder mucosa  reveals no ulcers, tumors, or lesions - No bladder stones - No trabeculation  Retroflexion shows no abnormalities   Post-Procedure: - Patient tolerated the procedure well  Assessment/ Plan: -Unremarkable cystoscopy -UA today did show 6-10 WBCs and was nitrite negative.  Urine culture was ordered.  He was given Levaquin 5 mg p.o. post cystoscopy instructed to call for fever or worsening symptoms.  He will be notified with his culture result.   Abbie Sons, MD

## 2020-01-09 LAB — URINALYSIS, COMPLETE
Bilirubin, UA: NEGATIVE
Glucose, UA: NEGATIVE
Ketones, UA: NEGATIVE
Leukocytes,UA: NEGATIVE
Nitrite, UA: NEGATIVE
RBC, UA: NEGATIVE
Specific Gravity, UA: >1.03 — ABNORMAL HIGH (ref 1.005–1.030)
Urobilinogen, Ur: 0.2 mg/dL (ref 0.2–1.0)
pH, UA: 7 (ref 5.0–7.5)

## 2020-01-09 LAB — MICROSCOPIC EXAMINATION
Bacteria, UA: NONE SEEN
RBC: NONE SEEN /hpf (ref 0–2)

## 2020-01-10 ENCOUNTER — Telehealth: Payer: Self-pay

## 2020-01-10 NOTE — Telephone Encounter (Signed)
Pt calls and questions his U/A which he viewed on MyChart advised pt that U/A was overall normal with cx results pending. Pt voiced understanding.

## 2020-01-11 LAB — CULTURE, URINE COMPREHENSIVE

## 2020-01-13 ENCOUNTER — Ambulatory Visit: Payer: BC Managed Care – PPO | Admitting: Neurology

## 2020-01-13 ENCOUNTER — Encounter: Payer: Self-pay | Admitting: Neurology

## 2020-01-13 ENCOUNTER — Other Ambulatory Visit: Payer: Self-pay

## 2020-01-13 ENCOUNTER — Ambulatory Visit (INDEPENDENT_AMBULATORY_CARE_PROVIDER_SITE_OTHER): Payer: BC Managed Care – PPO | Admitting: Neurology

## 2020-01-13 ENCOUNTER — Telehealth: Payer: Self-pay | Admitting: *Deleted

## 2020-01-13 DIAGNOSIS — M25562 Pain in left knee: Secondary | ICD-10-CM

## 2020-01-13 DIAGNOSIS — M79605 Pain in left leg: Secondary | ICD-10-CM | POA: Diagnosis not present

## 2020-01-13 NOTE — Telephone Encounter (Signed)
-----   Message from Abbie Sons, MD sent at 01/12/2020 12:39 PM EST ----- Urine culture was negative for infection

## 2020-01-13 NOTE — Telephone Encounter (Signed)
Notified patient as instructed, patient pleased. Discussed follow-up appointments, patient agrees  

## 2020-01-13 NOTE — Procedures (Signed)
     HISTORY:  Eric Vance is a 34 year old gentleman with a history of back pain and left leg pain, the leg pain is from the knee to the ankle area.  The patient is being evaluated for possible neuropathy or a lumbosacral radiculopathy.  NERVE CONDUCTION STUDIES:  Nerve conduction studies were performed on both lower extremities. The distal motor latencies and motor amplitudes for the peroneal and posterior tibial nerves were within normal limits. The nerve conduction velocities for these nerves were also normal. The sensory latencies for the peroneal and sural nerves were within normal limits. The F wave latencies for the posterior tibial nerves were within normal limits.   EMG STUDIES:  EMG study was performed on the left lower extremity:  The tibialis anterior muscle reveals 2 to 4K motor units with full recruitment. No fibrillations or positive waves were seen. The peroneus tertius muscle reveals 2 to 4K motor units with full recruitment. No fibrillations or positive waves were seen. The medial gastrocnemius muscle reveals 1 to 3K motor units with full recruitment. No fibrillations or positive waves were seen. The vastus lateralis muscle reveals 2 to 4K motor units with full recruitment. No fibrillations or positive waves were seen. The iliopsoas muscle reveals 2 to 4K motor units with full recruitment. No fibrillations or positive waves were seen. The biceps femoris muscle (long head) reveals 2 to 4K motor units with full recruitment. No fibrillations or positive waves were seen. The lumbosacral paraspinal muscles were tested at 3 levels, and revealed no abnormalities of insertional activity at all 3 levels tested. There was good relaxation.   IMPRESSION:  Nerve conduction studies done on both lower extremities were unremarkable, no evidence of a peripheral neuropathy was seen.  EMG evaluation of the left lower extremity was unremarkable, there is no evidence of an overlying  lumbosacral radiculopathy.  Jill Alexanders MD 01/13/2020 3:14 PM  [facility] [address] [place], [place] [address]  Phone [phone] Fax [phone]

## 2020-01-13 NOTE — Progress Notes (Signed)
[place]    Nerve / Sites Muscle Latency Ref. Amplitude Ref. Rel Amp Segments Distance Velocity Ref. Area    ms ms mV mV %  cm m/s m/s mVms  R Peroneal - EDB     Ankle EDB 5.0 ?6.5 8.4 ?2.0 100 Ankle - EDB 9   33.4     Fib head EDB 11.6  8.0  94.5 Fib head - Ankle 29 44 ?44 32.1     Pop fossa EDB 13.9  8.3  104 Pop fossa - Fib head 10 44 ?44 34.4         Pop fossa - Ankle      L Peroneal - EDB     Ankle EDB 4.8 ?6.5 9.1 ?2.0 100 Ankle - EDB 9   31.8     Fib head EDB 11.5  8.4  92.4 Fib head - Ankle 30 45 ?44 30.6     Pop fossa EDB 13.7  8.3  98.6 Pop fossa - Fib head 10 46 ?44 31.3         Pop fossa - Ankle      R Tibial - AH     Ankle AH 4.2 ?5.8 17.7 ?4.0 100 Ankle - AH 9   54.2     Pop fossa AH 13.8  14.1  80 Pop fossa - Ankle 39 41 ?41 51.8  L Tibial - AH     Ankle AH 4.8 ?5.8 11.9 ?4.0 100 Ankle - AH 9   36.3     Pop fossa AH 14.5  9.0  75.4 Pop fossa - Ankle 40 41 ?41 35.6             SNC    Nerve / Sites Rec. Site Peak Lat Ref.  Amp Ref. Segments Distance    ms ms V V  cm  R Sural - Ankle (Calf)     Calf Ankle 4.0 ?4.4 14 ?6 Calf - Ankle 14  L Sural - Ankle (Calf)     Calf Ankle 4.1 ?4.4 17 ?6 Calf - Ankle 14  R Superficial peroneal - Ankle     Lat leg Ankle 4.1 ?4.4 7 ?6 Lat leg - Ankle 14  L Superficial peroneal - Ankle     Lat leg Ankle 4.1 ?4.4 7 ?6 Lat leg - Ankle 14             F  Wave    Nerve F Lat Ref.   ms ms  R Tibial - AH 55.2 ?56.0  L Tibial - AH 55.7 ?56.0

## 2020-01-13 NOTE — Progress Notes (Signed)
Please refer to EMG and nerve conduction procedure note.  

## 2020-02-04 ENCOUNTER — Other Ambulatory Visit: Payer: Self-pay | Admitting: Urology

## 2020-02-13 ENCOUNTER — Other Ambulatory Visit: Payer: Self-pay | Admitting: Neurology

## 2020-02-13 DIAGNOSIS — R200 Anesthesia of skin: Secondary | ICD-10-CM

## 2020-03-12 ENCOUNTER — Ambulatory Visit
Admission: RE | Admit: 2020-03-12 | Discharge: 2020-03-12 | Disposition: A | Discharge status: Home / Self Care | Payer: BC Managed Care – PPO | Source: Ambulatory Visit | Attending: Neurology | Admitting: Neurology

## 2020-03-12 ENCOUNTER — Other Ambulatory Visit: Payer: Self-pay

## 2020-03-12 DIAGNOSIS — R200 Anesthesia of skin: Secondary | ICD-10-CM

## 2020-03-12 MED ORDER — GADOBENATE DIMEGLUMINE 529 MG/ML IV SOLN
15.0000 mL | Freq: Once | INTRAVENOUS | Status: AC | PRN
  Administered 2020-03-12: 15 mL via INTRAVENOUS

## 2020-04-23 ENCOUNTER — Other Ambulatory Visit: Payer: Self-pay

## 2020-04-23 ENCOUNTER — Ambulatory Visit: Payer: BC Managed Care – PPO | Admitting: Urology

## 2020-04-23 VITALS — BP 133/91 | HR 70 | Ht 70.0 in | Wt 159.0 lb

## 2020-04-23 DIAGNOSIS — N529 Male erectile dysfunction, unspecified: Secondary | ICD-10-CM | POA: Diagnosis not present

## 2020-04-23 DIAGNOSIS — R399 Unspecified symptoms and signs involving the genitourinary system: Secondary | ICD-10-CM | POA: Diagnosis not present

## 2020-04-23 DIAGNOSIS — N4340 Spermatocele of epididymis, unspecified: Secondary | ICD-10-CM | POA: Diagnosis not present

## 2020-04-23 NOTE — Progress Notes (Signed)
04/23/2020 2:06 PM   Eric Vance 1986/02/09 [id number]  Referring provider: Dion Body, MD [facility] [place],  [place] [address]  Chief Complaint  Patient presents with  . Erectile Dysfunction    HPI: 34 yo male who presents for follow-up to discuss several ongoing urologic problems.  -History recurrent right spermatocele initially aspirated by Dr. Yves Dill several years ago.  Underwent aspiration 2019 -States since last visit he has had gradual return of his right spermatocele.  He has mild discomfort which he describes as an itching sensation  -Recurrent UTI (E. coli) 07/2019 felt secondary to an incompletely treated prostatitis -Renal ultrasound showed no upper tract abnormalities -UTI symptoms resolved after follow-up treatment  -Bothersome lower urinary tract symptoms including urinary frequency, urgency, intermittent and weak urinary stream -IPSS today 23/35 -Cystoscopy 12/2019 showed no strictures or other abnormalities -Since last visit 12/2019 he notes leakage of small volumes of urine that he is unaware which occurs 30-60 minutes after voiding.  He does not have to wear pads but states he will have spots of urine that will show through his pants -No improvement with a review his trial tamsulosin -Has been evaluated by neurology for possible lumbar radiculopathy which was negative -Currently being seen for possible neuropathy -Has also had a negative rheumatology evaluation    -Also with ED -Complains of difficulty achieving and maintaining an erection. -Erections firm enough for penetration approximately 50% of the time -SHIM 10/25 indicating moderate ED -Has been on tadalafil 20 mg as needed with improvement initially though does not seem to be as effective -Testosterone level has been low normal   PMH: Past Medical History:  Diagnosis Date  . Lupus ([place])   . Seasonal allergies     Surgical History: No past surgical  history on file.  Home Medications:  Allergies as of 04/23/2020   No Known Allergies     Medication List       Accurate as of April 23, 2020  2:06 PM. If you have any questions, ask your nurse or doctor.        acetaminophen 650 MG CR tablet Commonly known as: TYLENOL Take by mouth.   diazepam 10 MG tablet Commonly known as: VALIUM 1 tab po 30 min prior to procedure   fexofenadine 180 MG tablet Commonly known as: ALLEGRA Take by mouth.   Flonase Sensimist 27.5 MCG/SPRAY nasal spray Generic drug: fluticasone Place into the nose.   gabapentin 300 MG capsule Commonly known as: NEURONTIN gabapentin 300 mg capsule  TAKE 1 TABLET BY MOUTH ONCE DAILY FOR 7 DAYS. THEN TAKE 2 TIMES A DAY AFTER THAT   ketoconazole 2 % shampoo Commonly known as: NIZORAL   levocetirizine 5 MG tablet Commonly known as: XYZAL Take by mouth.   meloxicam 15 MG tablet Commonly known as: MOBIC   montelukast 10 MG tablet Commonly known as: SINGULAIR Take by mouth.   Olopatadine HCl 0.6 % Soln   tadalafil 20 MG tablet Commonly known as: CIALIS TAKE 1 TABLET 1 HOUR PRIOR TO INTERCOURSE   tamsulosin 0.4 MG Caps capsule Commonly known as: FLOMAX Take 1 capsule (0.4 mg total) by mouth daily.   traZODone 50 MG tablet Commonly known as: DESYREL Take by mouth.       Allergies: No Known Allergies  Family History: Family History  Problem Relation Age of Onset  . Hypertension Father     Social History:  reports that he has never smoked. He has never used smokeless  tobacco. He reports current alcohol use. He reports that he does not use drugs.   Physical Exam: BP (!) 133/91   Pulse 70   Ht 5\' 10"  (1.778 m)   Wt 159 lb (72.1 kg)   BMI 22.81 kg/m   Constitutional:  Alert and oriented, No acute distress. HEENT: [place] AT, moist mucus membranes.  Trachea midline, no masses. Cardiovascular: No clubbing, cyanosis, or edema. Respiratory: Normal respiratory effort, no increased work of  breathing. GU: Phallus without lesions, testes descended bilaterally without masses or tenderness.  Moderate right spermatocele Skin: No rashes, bruises or suspicious lesions. Neurologic: Grossly intact, no focal deficits, moving all 4 extremities. Psychiatric: Normal mood and affect.    Assessment & Plan:    1.  Lower urinary tract symptoms -Moderate to severe voiding symptoms, both obstructive and storage related -Recent cystoscopy unremarkable -No improvement on tamsulosin -Currently undergoing a neurologic evaluation -Will ask Dr. Matilde Sprang to see for a second opinion; he may recommend a urodynamic study  2.  Erectile dysfunction -No significant organic risk factors -Currently undergoing urologic evaluation -Testosterone level has been borderline -Repeat T level along with a free testosterone and LH  3.  Right spermatocele -Recurrent and mildly symptomatic -He has had 2 previous aspirations and may want to consider spermatocelectomy in the future   Abbie Sons, MD  [facility] [address] [place], [place] [address] [phone]

## 2020-04-24 ENCOUNTER — Encounter: Payer: Self-pay | Admitting: Urology

## 2020-04-24 ENCOUNTER — Other Ambulatory Visit: Payer: BC Managed Care – PPO

## 2020-04-24 DIAGNOSIS — N529 Male erectile dysfunction, unspecified: Secondary | ICD-10-CM

## 2020-04-25 LAB — TESTOSTERONE FREE, PROFILE I
Albumin: 4.6 g/dL (ref 4.0–5.0)
Sex Hormone Binding: 31.7 nmol/L (ref 16.5–55.9)
Testost., Free, Calc: 63.1 pg/mL (ref 42.3–190.0)
Testosterone: 322 ng/dL (ref 264–916)

## 2020-04-25 LAB — PROLACTIN: Prolactin: 9.5 ng/mL (ref 4.0–15.2)

## 2020-04-25 LAB — LUTEINIZING HORMONE: LH: 3 m[IU]/mL (ref 1.7–8.6)

## 2020-04-27 ENCOUNTER — Telehealth: Payer: Self-pay | Admitting: *Deleted

## 2020-04-27 NOTE — Telephone Encounter (Signed)
-----   Message from Abbie Sons, MD sent at 04/26/2020 11:35 AM EDT ----- Free and total testosterone levels remain low normal.  Could consider treatment with Clomid however prior to any treatment would recommend checking fertility with a semen analysis

## 2020-04-27 NOTE — Telephone Encounter (Signed)
Notified patient as instructed, patient pleased. Discussed follow-up appointments, patient agrees  Left paperwork up front for patient to pick up and schedule him an appt at [facility]

## 2020-05-11 ENCOUNTER — Ambulatory Visit: Payer: Self-pay | Admitting: Urology

## 2020-05-11 ENCOUNTER — Ambulatory Visit: Payer: BC Managed Care – PPO | Admitting: Urology

## 2020-05-11 ENCOUNTER — Other Ambulatory Visit: Payer: Self-pay

## 2020-05-11 ENCOUNTER — Encounter: Payer: Self-pay | Admitting: Urology

## 2020-05-11 DIAGNOSIS — R399 Unspecified symptoms and signs involving the genitourinary system: Secondary | ICD-10-CM | POA: Diagnosis not present

## 2020-05-11 DIAGNOSIS — N390 Urinary tract infection, site not specified: Secondary | ICD-10-CM

## 2020-05-11 LAB — BLADDER SCAN AMB NON-IMAGING: Scan Result: 3

## 2020-05-11 NOTE — Progress Notes (Signed)
05/11/2020 3:13 PM   Eric Vance 1986/06/27 [id number]  Referring provider: Dion Body, MD [facility] [place],   [address]  Chief Complaint  Patient presents with  . Urinary Frequency    HPI: Dr Francella Solian: Right spermatocele and positive urine culture from incompletely treated prostatitis.  Cystoscopy February 2021 normal.  He leaks a small amount after voiding.  Failed Flomax.  Being evaluated for possible neuropathy.  Has erectile dysfunction given tadalafil.  Testosterone level low normal  For approximately 1 year patient has complicated voiding dysfunction in description.  He can leak 2-5 times within an hour 1-6 times a day.  It can be well before after urination.  He can leak with coughing sneezing and bending lifting but not every occasion.  Bending over to tie shoes he would leak.  Once or twice a month he leaks a small amount sleeping.  He actually normally does not have urge incontinence.  After he eats a meal he can have urgency 3 or 4 times in a row.  He voids 5 times a day and gets up at least once a night.  He leaks without awareness.  He changes his underclothes but does not wear a pad  In the last 6 months his flow has worsened.  60% of the time flow good.  Other times his flow will start but he can sit and push for up to 15 minutes in order to empty his bladder.  He has been treated for 1 bladder infection.  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  I do not feel he ever had an MRI due to cost concern.  He has no loss of sensation around the scrotum or perineum  No stigmata spinal difficulty normal sensation of perineal area and scrotum   PMH: Past Medical History:  Diagnosis Date  . Lupus ([place])   . Seasonal allergies     Surgical History: No past surgical history on  file.  Home Medications:  Allergies as of 05/11/2020   No Known Allergies     Medication List       Accurate as of May 11, 2020  3:13 PM. If you have any questions, ask your nurse or doctor.        DULoxetine 30 MG capsule Commonly known as: CYMBALTA Take by mouth.   fexofenadine 180 MG tablet Commonly known as: ALLEGRA Take by mouth.   Flonase Sensimist 27.5 MCG/SPRAY nasal spray Generic drug: fluticasone Place into the nose.   gabapentin 300 MG capsule Commonly known as: NEURONTIN gabapentin 300 mg capsule  TAKE 1 TABLET BY MOUTH ONCE DAILY FOR 7 DAYS. THEN TAKE 2 TIMES A DAY AFTER THAT   meloxicam 15 MG tablet Commonly known as: MOBIC   montelukast 10 MG tablet Commonly known as: SINGULAIR Take by mouth.   tadalafil 20 MG tablet Commonly known as: CIALIS TAKE 1 TABLET 1 HOUR PRIOR TO INTERCOURSE   traZODone 50 MG tablet Commonly known as: DESYREL Take by mouth.       Allergies: No Known Allergies  Family History: Family History  Problem Relation Age of Onset  . Hypertension Father     Social History:  reports that he has never smoked. He has never used smokeless tobacco. He reports current alcohol use. He reports that he does not  use drugs.  ROS:                                        Physical Exam: There were no vitals taken for this visit.  Constitutional:  Alert and oriented, No acute distress.  Laboratory Data: Lab Results  Component Value Date   WBC 5.9 09/16/2010   HGB 14.5 09/16/2010   HCT 43.6 09/16/2010   MCV 94.6 09/16/2010   PLT 221 09/16/2010    No results found for: CREATININE  No results found for: PSA  Lab Results  Component Value Date   TESTOSTERONE 322 04/24/2020    No results found for: HGBA1C  Urinalysis    Component Value Date/Time   APPEARANCEUR Cloudy (A) 01/08/2020 1352   GLUCOSEU Negative 01/08/2020 1352   BILIRUBINUR Negative 01/08/2020 1352   PROTEINUR 3+ (A) 01/08/2020  1352   NITRITE Negative 01/08/2020 1352   LEUKOCYTESUR Negative 01/08/2020 1352    Pertinent Imaging:   Assessment & Plan: Patient leaks without awareness.  Sometimes he gets urgency after a meal.  His flow symptoms are worsening and he sits and urinates many times over several minutes.  He can leak a small amount while he sleeping.  He says he leaks definitely when he leans forward to tie his shoe.  He is uncommon bedwetting cystoscopy was normal.  His residual today was 3 mL.  Role of urodynamics discussed.  He has a very odd presentation of symptoms not fitting 1 particular pattern.  We will assess him for an overactive bladder and would be very rare for him to have stress incontinence.  Renal ultrasound in October 2020  He understands that his symptoms do not really fit a pattern.  We will also be looking for evidence of neurogenic bladder with high pressures.  We will look for the evidence of bladder neck dyssynergia and we will also look for the evidence of external sphincter dyssynergia.  Hopefully he will be able to void in the lab setting.  He said he can date some of his leaking back 15 years.  He has very discrete pain in the left leg; some forearm symptoms and some knee symptoms but they have not sorted out a firm neurologic diagnosis or rheumatoid arthritis diagnosis etc.  1. Recurrent UTI   2. Lower urinary tract symptoms  - Urinalysis, Complete - Bladder Scan (Post Void Residual) in office   No follow-ups on file.  Reece Packer, MD  [facility] [address], [place], [place] [address] [phone]

## 2020-05-12 LAB — URINALYSIS, COMPLETE
Bilirubin, UA: NEGATIVE
Glucose, UA: NEGATIVE
Ketones, UA: NEGATIVE
Leukocytes,UA: NEGATIVE
Nitrite, UA: NEGATIVE
Protein,UA: NEGATIVE
RBC, UA: NEGATIVE
Specific Gravity, UA: 1.02 (ref 1.005–1.030)
Urobilinogen, Ur: 0.2 mg/dL (ref 0.2–1.0)
pH, UA: 6.5 (ref 5.0–7.5)

## 2020-05-12 LAB — MICROSCOPIC EXAMINATION
Bacteria, UA: NONE SEEN
RBC, Urine: NONE SEEN /hpf (ref 0–2)

## 2020-05-14 LAB — CULTURE, URINE COMPREHENSIVE

## 2020-05-21 IMAGING — US US RENAL
1 series · 14 of 25 positions shown · non-contrast
Comparison: None.

CLINICAL DATA: Initial evaluation for recurrent UTI.

EXAM:
RENAL / URINARY TRACT ULTRASOUND COMPLETE

[Series 1: us renal · 0.18mm/px · 14 of 33 slices shown]
[im 1/33]
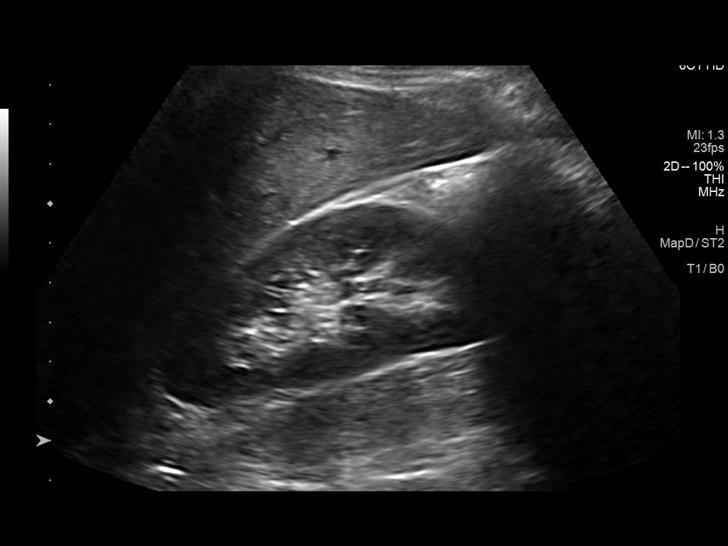
[im 3/33]
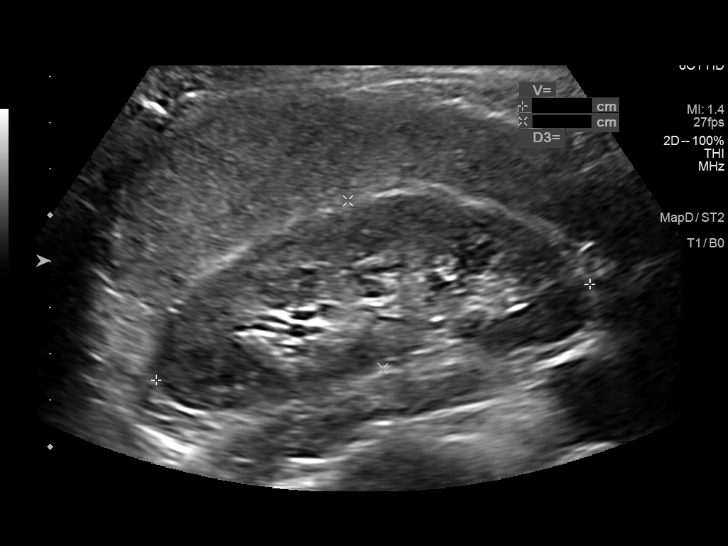
[im 6/33]
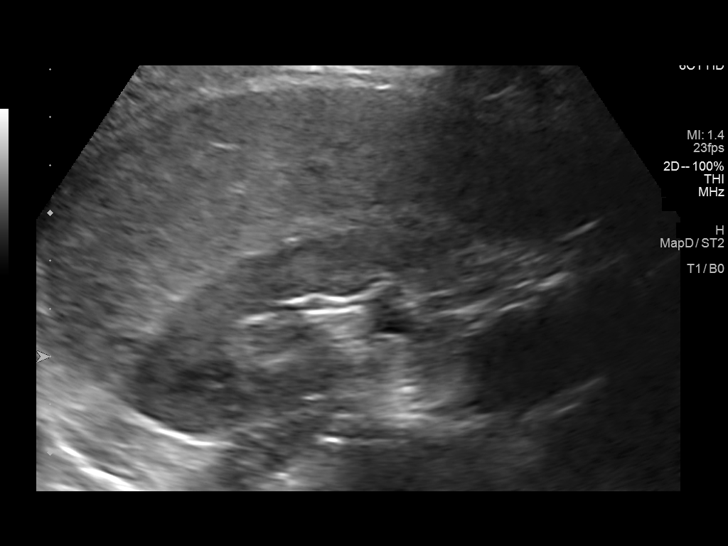
[im 9/33]
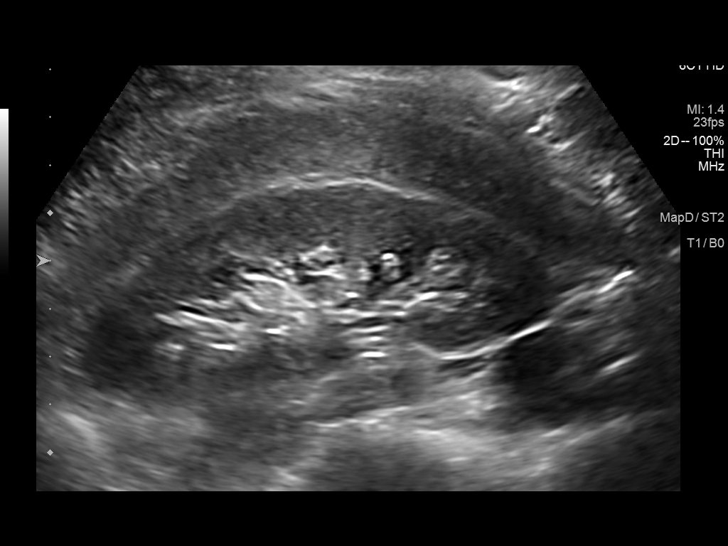
[im 11/33]
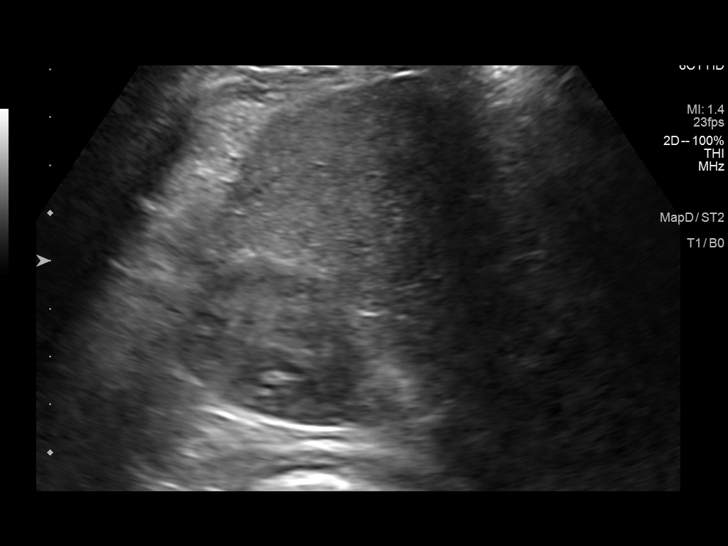
[im 13/33]
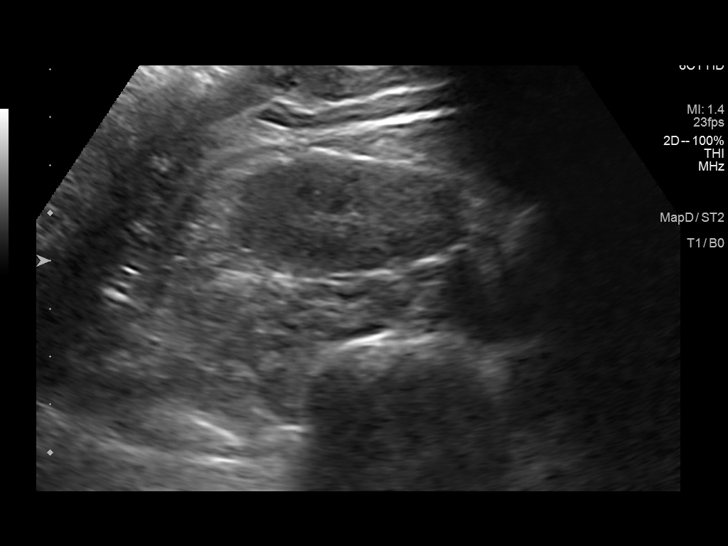
[im 15/33]
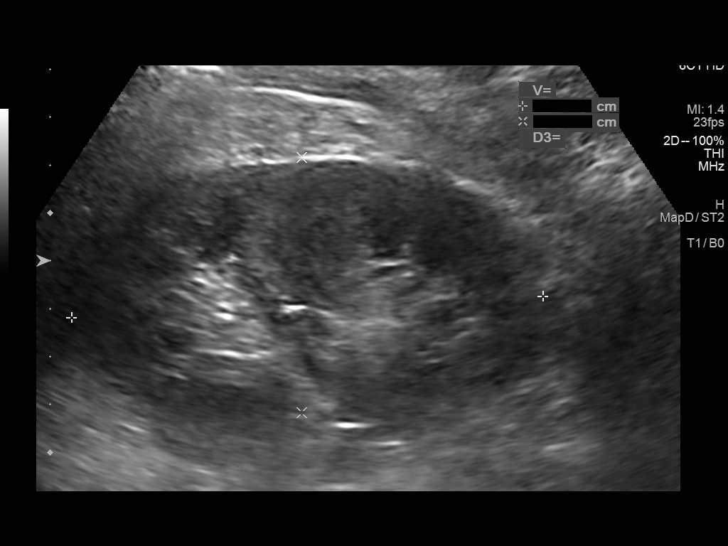
[im 18/33]
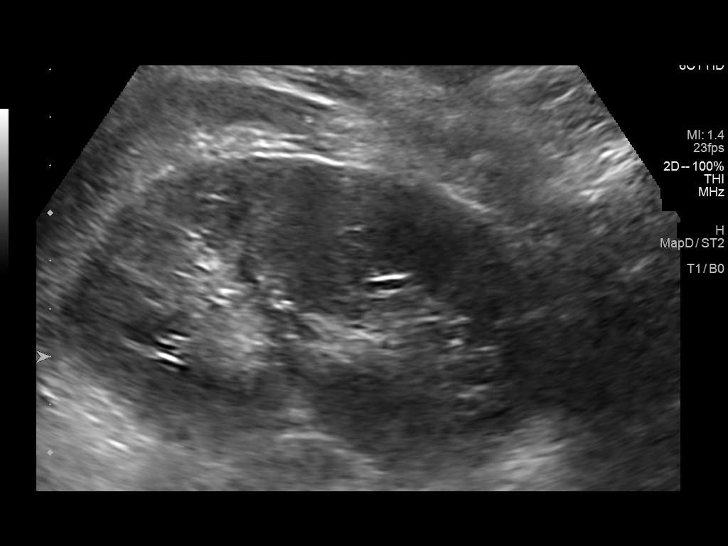
[im 21/33]
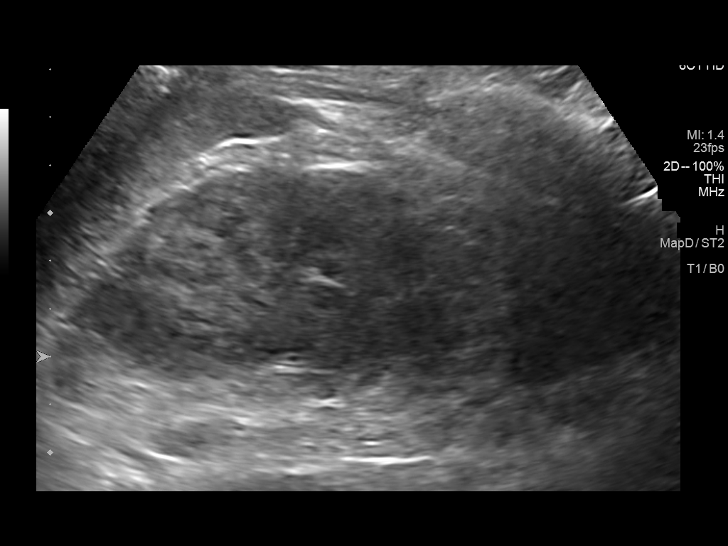
[im 22/33]
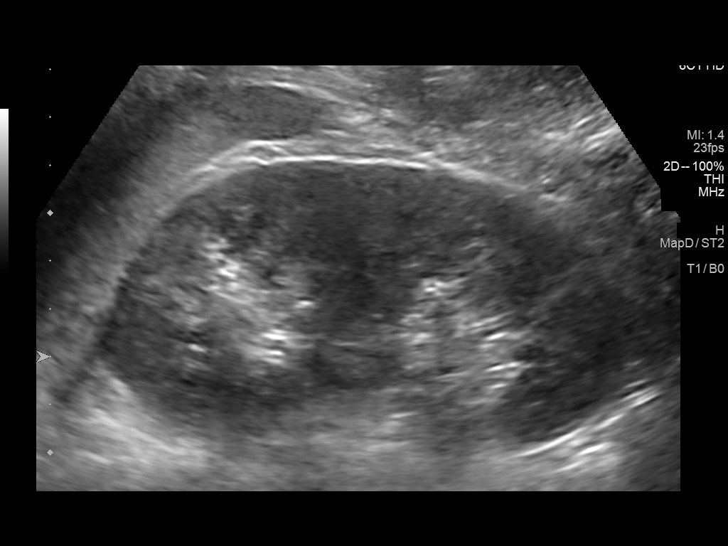
[im 25/33]
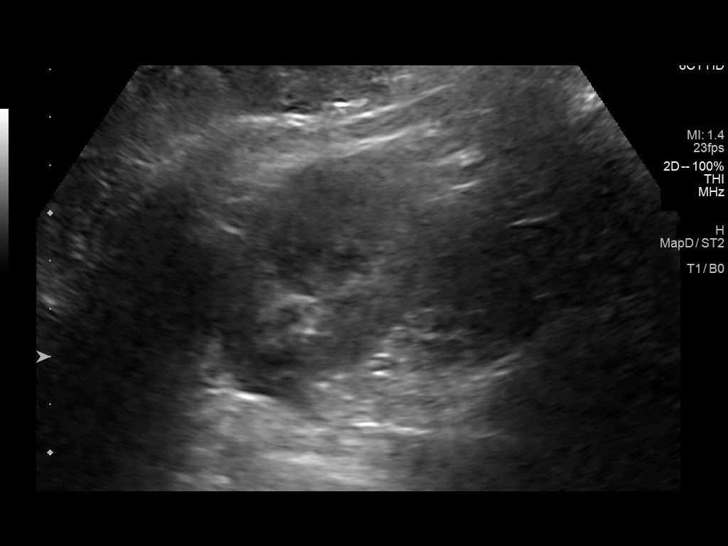
[im 27/33]
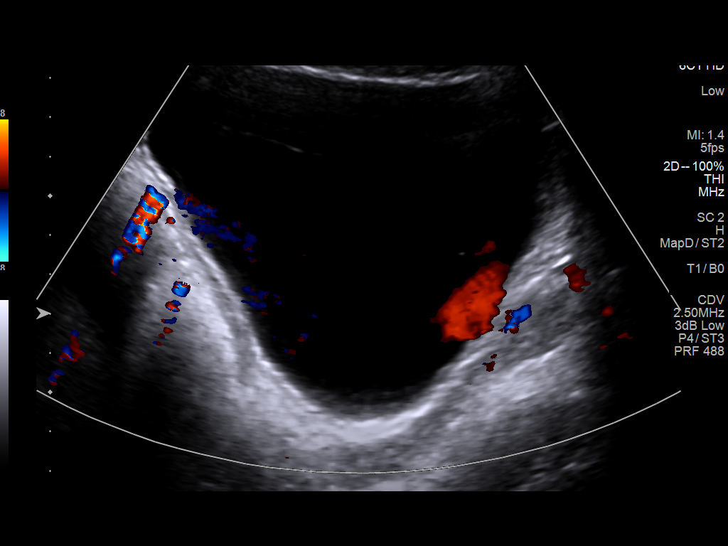
[im 30/33]
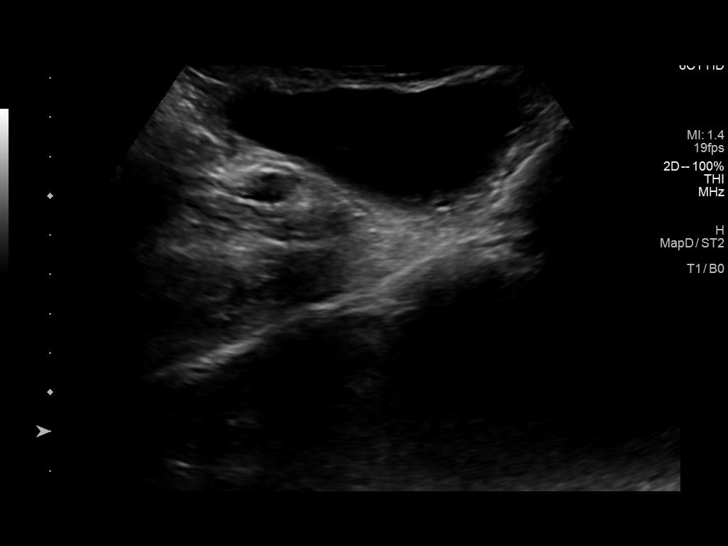
[im 33/33]
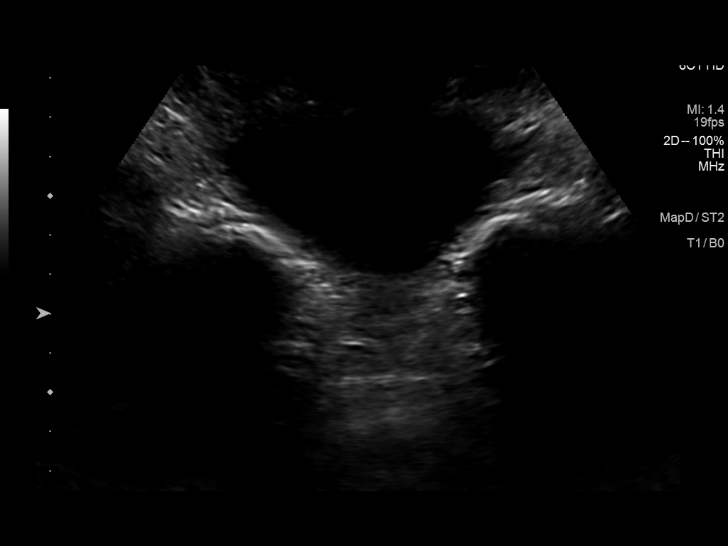

[14 of 25 positions shown; findings below may reference images not displayed]

FINDINGS: Right Kidney:

Renal measurements: 9.6 x 3.7 x 4.4 cm = volume: 82.4 mL .
Echogenicity within normal limits. No mass or hydronephrosis
visualized.

Left Kidney:

Renal measurements: 9.9 x 5.3 x 5.3 cm = volume: 145.3 mL.
Echogenicity within normal limits. No mass or hydronephrosis
visualized.

Bladder:

Appears normal for degree of bladder distention.
IMPRESSION: Normal renal ultrasound.

## 2020-05-25 ENCOUNTER — Other Ambulatory Visit: Payer: Self-pay | Admitting: Urology

## 2020-06-04 ENCOUNTER — Other Ambulatory Visit: Payer: Self-pay | Admitting: Urology

## 2020-06-08 ENCOUNTER — Ambulatory Visit: Payer: Self-pay | Admitting: Urology

## 2020-06-15 ENCOUNTER — Ambulatory Visit (INDEPENDENT_AMBULATORY_CARE_PROVIDER_SITE_OTHER): Payer: BC Managed Care – PPO | Admitting: Urology

## 2020-06-15 ENCOUNTER — Encounter: Payer: Self-pay | Admitting: Urology

## 2020-06-15 ENCOUNTER — Other Ambulatory Visit: Payer: Self-pay

## 2020-06-15 VITALS — BP 147/99 | HR 88

## 2020-06-15 DIAGNOSIS — N3946 Mixed incontinence: Secondary | ICD-10-CM

## 2020-06-15 MED ORDER — MIRABEGRON ER 25 MG PO TB24
25.0000 mg | ORAL_TABLET | Freq: Every day | ORAL | 11 refills | Status: DC
Start: 2020-06-15 — End: 2021-05-31

## 2020-06-15 NOTE — Progress Notes (Signed)
06/15/2020 3:25 PM   Eric Vance [birth year] [id number]  Referring provider: Dion Body, MD [facility] [place],  [place] [address]  No chief complaint on file.   HPI: Dr Francella Solian: Right spermatocele and positive urine culture from incompletely treated prostatitis.  Cystoscopy February 2021 normal.  He leaks a small amount after voiding.  Failed Flomax.  Being evaluated for possible neuropathy.  Has erectile dysfunction given tadalafil.  Testosterone level low normal  For approximately 1 year patient has complicated voiding dysfunction in description.  He can leak 2-5 times within an hour 1-6 times a day.  It can be well before after urination.  He can leak with coughing sneezing and bending lifting but not every occasion.  Bending over to tie shoes he would leak.  Once or twice a month he leaks a small amount sleeping.  He actually normally does not have urge incontinence.  After he eats a meal he can have urgency 3 or 4 times in a row.  He voids 5 times a day and gets up at least once a night.  He leaks without awareness.  He changes his underclothes but does not wear a pad  In the last 6 months his flow has worsened.  60% of the time flow good.  Other times his flow will start but he can sit and push for up to 15 minutes in order to empty his bladder.  He has been treated for 1 bladder infection.  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  I do not feel he ever had an MRI due to cost concern.  He has no loss of sensation around the scrotum or perineum  Normal renal ultrasound October 2020  No stigmata spinal difficulty normal sensation of perineal area and scrotum  Patient leaks without awareness.  Sometimes he gets urgency after a meal.  His flow symptoms are worsening and he sits and urinates many  times over several minutes.  He can leak a small amount while he sleeping.  He says he leaks definitely when he leans forward to tie his shoe.  He is uncommon bedwetting cystoscopy was normal.  His residual today was 3 mL.  Role of urodynamics discussed.  He has a very odd presentation of symptoms not fitting 1 particular pattern.  We will assess him for an overactive bladder and would be very rare for him to have stress incontinence.  Renal ultrasound in October 2020  He understands that his symptoms do not really fit a pattern.  We will also be looking for evidence of neurogenic bladder with high pressures.  We will look for the evidence of bladder neck dyssynergia and we will also look for the evidence of external sphincter dyssynergia.  Hopefully he will be able to void in the lab setting.  He said he can date some of his leaking back 15 years.  He has very discrete pain in the left leg; some forearm symptoms and some knee symptoms but they have not sorted out a firm neurologic diagnosis or rheumatoid arthritis diagnosis etc.   Today Frequency and flow stable.  Incontinence stable. On urodynamics patient had not voided and was catheterized for 100 mL.  Maximum bladder capacity 650 mL.  He had some lower abdominal discomfort at 455 mL.  Bladder was stable.  He did note that coughing he thinks triggered his urgency.  No stress incontinence with Valsalva pressure 128 cm of water.  He tried for many minutes to void without straining and could not.  He had to strain finally triggered the contraction that he voided off.  He said he often strains to void and will have a bowel movement when voiding.  He voided 425 mL with max flow 30 mils per second.  Max voiding pressure 20 cm water.  Residual 228 mL.  EMG activity increased during the voiding phase from straining.  He is walking with a limp and having nerve issues in his leg.  He was recently put on prednisone.  He had a prolonged interrupted pattern and  did have trouble voiding in the lab setting.  He had no loss of bladder compliance.  Smooth-walled bladder no Christmas tree.  Details of the urodynamics are signed dictated  PMH: Past Medical History:  Diagnosis Date  . Lupus ([place])   . Seasonal allergies     Surgical History: No past surgical history on file.  Home Medications:  Allergies as of 06/15/2020   No Known Allergies     Medication List       Accurate as of June 15, 2020  3:25 PM. If you have any questions, ask your nurse or doctor.        DULoxetine 30 MG capsule Commonly known as: CYMBALTA Take by mouth.   fexofenadine 180 MG tablet Commonly known as: ALLEGRA Take by mouth.   Flonase Sensimist 27.5 MCG/SPRAY nasal spray Generic drug: fluticasone Place into the nose.   gabapentin 300 MG capsule Commonly known as: NEURONTIN gabapentin 300 mg capsule  TAKE 1 TABLET BY MOUTH ONCE DAILY FOR 7 DAYS. THEN TAKE 2 TIMES A DAY AFTER THAT   meloxicam 15 MG tablet Commonly known as: MOBIC   montelukast 10 MG tablet Commonly known as: SINGULAIR Take by mouth.   tadalafil 20 MG tablet Commonly known as: CIALIS TAKE 1 TABLET 1 HOUR PRIOR TO INTERCOURSE   traZODone 50 MG tablet Commonly known as: DESYREL Take by mouth.       Allergies: No Known Allergies  Family History: Family History  Problem Relation Age of Onset  . Hypertension Father     Social History:  reports that he has never smoked. He has never used smokeless tobacco. He reports current alcohol use. He reports that he does not use drugs.  ROS:                                        Physical Exam: There were no vitals taken for this visit.    Laboratory Data: Lab Results  Component Value Date   WBC 5.9 09/16/2010   HGB 14.5 09/16/2010   HCT 43.6 09/16/2010   MCV 94.6 09/16/2010   PLT 221 09/16/2010    No results found for: CREATININE  No results found for: PSA  Lab Results  Component Value Date    TESTOSTERONE 322 04/24/2020    No results found for: HGBA1C  Urinalysis    Component Value Date/Time   APPEARANCEUR Clear 05/11/2020 1439   GLUCOSEU Negative 05/11/2020 1439   BILIRUBINUR Negative 05/11/2020 1439   PROTEINUR Negative 05/11/2020 1439   NITRITE Negative 05/11/2020 1439   LEUKOCYTESUR Negative 05/11/2020 1439    Pertinent Imaging:   Assessment & Plan: Patient's presentation is very much  out of the ordinary.  Based on urodynamics and presentation in my opinion he does not have stress incontinence.  I believe when he leaks when he is bending forward or leaks awareness is due to overactivity.  He can empty efficiently.  His flow was likely due to a poorly contractile bladder.  He did not have obvious external sphincter dyssynergia but was straining during the voiding phase.  He is a low pressure system  The role of overactive bladder medications in the beta 3 agonist discussed.  May make voiding phase worse at times.  Neuromodulation treatment such as InterStim is an option.  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  He said his flow symptoms are worsening.  Last Friday he was 10 or 12 hours before he could void.  This is the first time this happened.  His leaking is from overactivity that he does not feel.  He understands the options noted.  He is not can learn how to self catheterize at this stage.  He understands the beta 3 agonist could worsen his symptoms.  Reassess on Myrbetriq 25 mg samples and prescription and reassess in 6 weeks with a residual.  He does have more neurologic testing coming  It must be frustrating having a symptoms with some unknown diagnosis.  He understands conceptually about InterStim but certainly we would not do this at this stage of the condition.  He understands the urodynamics support the diagnosis of neurogenic bladder but are not definitive  There are no diagnoses linked to this encounter.  No follow-ups on  file.  Reece Packer, MD  [facility] [address] [place], [place] [address] [phone]

## 2020-06-29 DIAGNOSIS — I10 Essential (primary) hypertension: Secondary | ICD-10-CM | POA: Insufficient documentation

## 2020-06-29 DIAGNOSIS — N319 Neuromuscular dysfunction of bladder, unspecified: Secondary | ICD-10-CM | POA: Insufficient documentation

## 2020-07-27 ENCOUNTER — Ambulatory Visit: Payer: BC Managed Care – PPO | Admitting: Urology

## 2020-07-27 ENCOUNTER — Other Ambulatory Visit: Payer: Self-pay

## 2020-07-27 ENCOUNTER — Encounter: Payer: Self-pay | Admitting: Urology

## 2020-07-27 VITALS — BP 143/88 | HR 93 | Ht 70.0 in | Wt 160.0 lb

## 2020-07-27 DIAGNOSIS — N3946 Mixed incontinence: Secondary | ICD-10-CM | POA: Diagnosis not present

## 2020-07-27 LAB — BLADDER SCAN AMB NON-IMAGING: Scan Result: 0

## 2020-07-27 MED ORDER — GEMTESA 75 MG PO TABS: 75.0000 mg | ORAL_TABLET | Freq: Every day | ORAL | 11 refills | Status: DC

## 2020-07-27 NOTE — Progress Notes (Signed)
07/27/2020 10:06 AM   Eric Vance 03/22/1986 [id number]  Referring provider: Dion Body, MD [facility] [place],  [place] [address]  Chief Complaint  Patient presents with  . 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 Cystoscopy February 2021 normal. Failed Flomax. Being evaluated for possible neuropathy.Has erectile dysfunction given tadalafil. Testosterone level low normal  For approximately 1 year patient has complicated voiding dysfunction in description. He can leak 2-5 times within an hour 1-6 times a day. It can be well before or after urination. He can leak with coughing sneezing and bending lifting but not every occasion. Bending over to tie shoes he would leak. Once or twice a month he leaks a small amount sleeping. He actually normally does not have urge incontinence. After he eats a meal he can have urgency 3 or 4 times in a row. He voids 5 times a day and gets up at least once a night. He leaks without awareness. He changes his underclothes but does not wear a pad  In the last 6 months his flow has worsened. 60% of the time flow good. Other times his flow will start but he can sit and push for up to 15 minutes in order to empty his bladder.  He has been treated for 1 bladder infection. 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 I do not feel he ever had an MRI due to cost concern. He has no loss of sensation around the scrotum or perineum  No stigmata spinal difficulty normal sensation of perineal area and scrotum  Patient leaks without awareness. Sometimes he gets urgency after a meal. His flow symptoms are worsening and he sits and urinates many times over several minutes. He can leak a small amount while he sleeping.  He says he leaks definitely when he leans forward to tie his shoe. He is uncommon bedwetting   He understands that his symptoms do not really fit a pattern. We will also be looking for evidence of neurogenic bladder with high pressures. We will look for the evidence of bladder neck dyssynergia and we will also look for the evidence of external sphincter dyssynergia. Hopefully he will be able to void in the lab setting. He said he can date some of his leaking back 15 years.  He has very discrete pain in the left leg; some forearm symptoms and some knee symptoms but they have not sorted out a firm neurologic diagnosis or rheumatoid arthritis diagnosis etc.   On urodynamics patient had not voided and was catheterized for 100 mL.  Maximum bladder capacity 650 mL.  He had some lower abdominal discomfort at 455 mL.  Bladder was stable.  He did note that coughing he thinks triggered his urgency.  No stress incontinence with Valsalva pressure 128 cm of water.  He tried for many minutes to void without straining and could not.  He had to strain finally triggered the contraction that he voided off.  He said he often strains to void and will have a bowel movement when voiding.  He voided 425 mL with max flow 30 mils per second.  Max voiding pressure 20 cm water.  Residual 228 mL.  EMG activity increased during the voiding phase from straining.  He is walking with a limp  and having nerve issues in his leg.  He was recently put on prednisone.  He had a prolonged interrupted pattern and did have trouble voiding in the lab setting.  He had no loss of bladder compliance.  Smooth-walled bladder no Christmas tree.    Patient's presentation is very much out of the ordinary.  Based on urodynamics and presentation in my opinion he does not have stress incontinence.  I believe when he leaks when he is bending forward or leaks awareness is due to overactivity.  He can empty efficiently.  His flow was likely due to a  poorly contractile bladder.  He did not have obvious external sphincter dyssynergia but was straining during the voiding phase.  He is a low pressure system  The role of overactive bladder medications in the beta 3 agonist discussed.  May make voiding phase worse at times.  Neuromodulation treatment such as InterStim is an option.  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  He said his flow symptoms are worsening.  Last Friday he was 10 or 12 hours before he could void.  This is the first time this happened.  His leaking is from overactivity that he does not feel.  He understands the options noted.  He is not can learn how to self catheterize at this stage.  He understands the beta 3 agonist could worsen his symptoms.  Reassess on Myrbetriq 25 mg samples and prescription and reassess in 6 weeks with a residual.  He does have more neurologic testing coming  It must be frustrating having a symptoms with some unknown diagnosis.  He understands conceptually about InterStim but certainly we would not do this at this stage of the condition.  He understands the urodynamics support the diagnosis of neurogenic bladder but are not definitive  Today Frequency stable.  Today's residual 0 mL Patient said for 3 weeks he did not leak without awareness or with bending and it was completely dry.  Now a 70% better.  It did not slow down his flow.  3 days he did have some trouble to urinate on different days but overall flow has been consistent and unchanged.  Clinically not infected      PMH: Past Medical History:  Diagnosis Date  . Lupus ([place])   . Seasonal allergies     Surgical History: No past surgical history on file.  Home Medications:  Allergies as of 07/27/2020      Reactions   Diclofenac Itching, Rash      Medication List       Accurate as of July 27, 2020 10:06 AM. If you have any questions, ask your nurse or doctor.        DULoxetine 30 MG  capsule Commonly known as: CYMBALTA Take by mouth.   fexofenadine 180 MG tablet Commonly known as: ALLEGRA Take by mouth.   Flonase Sensimist 27.5 MCG/SPRAY nasal spray Generic drug: fluticasone Place into the nose.   gabapentin 300 MG capsule Commonly known as: NEURONTIN gabapentin 300 mg capsule  TAKE 1 TABLET BY MOUTH ONCE DAILY FOR 7 DAYS. THEN TAKE 2 TIMES A DAY AFTER THAT   meloxicam 15 MG tablet Commonly known as: MOBIC   mirabegron ER 25 MG Tb24 tablet Commonly known as: MYRBETRIQ Take 1 tablet (25 mg total) by mouth daily.   montelukast 10 MG tablet Commonly known as: SINGULAIR Take by mouth.   tadalafil 20 MG tablet Commonly known as: CIALIS TAKE 1 TABLET 1 HOUR  PRIOR TO INTERCOURSE   traZODone 50 MG tablet Commonly known as: DESYREL Take by mouth.       Allergies:  Allergies  Allergen Reactions  . Diclofenac Itching and Rash    Family History: Family History  Problem Relation Age of Onset  . Hypertension Father     Social History:  reports that he has never smoked. He has never used smokeless tobacco. He reports current alcohol use. He reports that he does not use drugs.  ROS:                                        Physical Exam: There were no vitals taken for this visit.  Constitutional:  Alert and oriented, No acute distress.   Laboratory Data: Lab Results  Component Value Date   WBC 5.9 09/16/2010   HGB 14.5 09/16/2010   HCT 43.6 09/16/2010   MCV 94.6 09/16/2010   PLT 221 09/16/2010    No results found for: CREATININE  No results found for: PSA  Lab Results  Component Value Date   TESTOSTERONE 322 04/24/2020    No results found for: HGBA1C  Urinalysis    Component Value Date/Time   APPEARANCEUR Clear 05/11/2020 1439   GLUCOSEU Negative 05/11/2020 1439   BILIRUBINUR Negative 05/11/2020 1439   PROTEINUR Negative 05/11/2020 1439   NITRITE Negative 05/11/2020 1439   LEUKOCYTESUR Negative  05/11/2020 1439    Pertinent Imaging:   Assessment & Plan: He will try the new beta 3 agonist for 1 month with samples co-pay card and prescription.  I will see him back in 7 weeks.  He will go back on the Myrbetriq at home or when I see him if it does not work as well  There are no diagnoses linked to this encounter.  No follow-ups on file.  Reece Packer, MD  [facility] [address], [place], [place] [address] [phone]

## 2020-08-11 ENCOUNTER — Telehealth: Payer: Self-pay | Admitting: *Deleted

## 2020-08-11 NOTE — Telephone Encounter (Signed)
Patient called in to change is appt  And to let us know that he is going back on Myrbetriq

## 2020-08-24 ENCOUNTER — Encounter: Payer: Self-pay | Admitting: Unknown Physician Specialty

## 2020-08-24 ENCOUNTER — Other Ambulatory Visit: Payer: Self-pay

## 2020-08-26 ENCOUNTER — Other Ambulatory Visit: Payer: Self-pay

## 2020-08-26 ENCOUNTER — Other Ambulatory Visit
Admission: RE | Admit: 2020-08-26 | Discharge: 2020-08-26 | Disposition: A | Discharge status: Home / Self Care | Payer: BC Managed Care – PPO | Source: Ambulatory Visit | Attending: Unknown Physician Specialty | Admitting: Unknown Physician Specialty

## 2020-08-26 DIAGNOSIS — Z20822 Contact with and (suspected) exposure to covid-19: Secondary | ICD-10-CM | POA: Insufficient documentation

## 2020-08-26 DIAGNOSIS — Z01812 Encounter for preprocedural laboratory examination: Secondary | ICD-10-CM | POA: Insufficient documentation

## 2020-08-26 NOTE — Discharge Instructions (Signed)
[facility] ENDOSCOPIC SINUS SURGERY [facility]  What is Functional Endoscopic Sinus Surgery?  The Surgery involves making the natural openings of the sinuses larger by removing the bony partitions that separate the sinuses from the nasal cavity.  The natural sinus lining is preserved as much as possible to allow the sinuses to resume normal function after the surgery.  In some patients nasal polyps (excessively swollen lining of the sinuses) may be removed to relieve obstruction of the sinus openings.  The surgery is performed through the nose using lighted scopes, which eliminates the need for incisions on the face.  A septoplasty is a different procedure which is sometimes performed with sinus surgery.  It involves straightening the boy partition that separates the two sides of your nose.  A crooked or deviated septum may need repair if is obstructing the sinuses or nasal airflow.  Turbinate reduction is also often performed during sinus surgery.  The turbinates are bony proturberances from the side walls of the nose which swell and can obstruct the nose in patients with sinus and allergy problems.  Their size can be surgically reduced to help relieve nasal obstruction.  What Can Sinus Surgery Do For Me?  Sinus surgery can reduce the frequency of sinus infections requiring antibiotic treatment.  This can provide improvement in nasal congestion, post-nasal drainage, facial pressure and nasal obstruction.  Surgery will NOT prevent you from ever having an infection again, so it usually only for patients who get infections 4 or more times yearly requiring antibiotics, or for infections that do not clear with antibiotics.  It will not cure nasal allergies, so patients with allergies may still require medication to treat their allergies after surgery. Surgery may improve headaches related to sinusitis, however, some people will continue to  require medication to control sinus headaches related to allergies.  Surgery will do nothing for other forms of headache (migraine, tension or cluster).  What Are the Risks of Endoscopic Sinus Surgery?  Current techniques allow surgery to be performed safely with little risk, however, there are rare complications that patients should be aware of.  Because the sinuses are located around the eyes, there is risk of eye injury, including blindness, though again, this would be quite rare. This is usually a result of bleeding behind the eye during surgery, which puts the vision oat risk, though there are treatments to protect the vision and prevent permanent disrupted by surgery causing a leak of the spinal fluid that surrounds the brain.  More serious complications would include bleeding inside the brain cavity or damage to the brain.  Again, all of these complications are uncommon, and spinal fluid leaks can be safely managed surgically if they occur.  The most common complication of sinus surgery is bleeding from the nose, which may require packing or cauterization of the nose.  Continued sinus have polyps may experience recurrence of the polyps requiring revision surgery.  Alterations of sense of smell or injury to the tear ducts are also rare complications.   What is the Surgery Like, and what is the Recovery?  The Surgery usually takes a couple of hours to perform, and is usually performed under a general anesthetic (completely asleep).  Patients are usually discharged home after a couple of hours.  Sometimes during surgery it is necessary to pack the nose to control bleeding, and the packing is left in place for 24 - 48 hours, and removed by your surgeon.    If a septoplasty was performed during the procedure, there is often a splint placed which must be removed after 5-7 days.   Discomfort: Pain is usually mild to moderate, and can be controlled by prescription pain medication or acetaminophen (Tylenol).   Aspirin, Ibuprofen (Advil, Motrin), or Naprosyn (Aleve) should be avoided, as they can cause increased bleeding.  Most patients feel sinus pressure like they have a bad head cold for several days.  Sleeping with your head elevated can help reduce swelling and facial pressure, as can ice packs over the face.  A humidifier may be helpful to keep the mucous and blood from drying in the nose.   Diet: There are no specific diet restrictions, however, you should generally start with clear liquids and a light diet of bland foods because the anesthetic can cause some nausea.  Advance your diet depending on how your stomach feels.  Taking your pain medication with food will often help reduce stomach upset which pain medications can cause.  Nasal Saline Irrigation: It is important to remove blood clots and dried mucous from the nose as it is healing.  This is done by having you irrigate the nose at least 3 - 4 times daily with a salt water solution.  We recommend using NeilMed Sinus Rinse (available at the drug store).  Fill the squeeze bottle with the solution, bend over a sink, and insert the tip of the squeeze bottle into the nose  of an inch.  Point the tip of the squeeze bottle towards the inside corner of the eye on the same side your irrigating.  Squeeze the bottle and gently irrigate the nose.  If you bend forward as you do this, most of the fluid will flow back out of the nose, instead of down your throat.   The solution should be warm, near body temperature, when you irrigate.   Each time you irrigate, you should use a full squeeze bottle.   Note that if you are instructed to use Nasal Steroid Sprays at any time after your surgery, irrigate with saline BEFORE using the steroid spray, so you do not wash it all out of the nose. Another product, Nasal Saline Gel (such as AYR Nasal Saline Gel) can be applied in each nostril 3 - 4 times daily to moisture the nose and reduce scabbing or crusting.  Bleeding:   Bloody drainage from the nose can be expected for several days, and patients are instructed to irrigate their nose frequently with salt water to help remove mucous and blood clots.  The drainage may be dark red or brown, though some fresh blood may be seen intermittently, especially after irrigation.  Do not blow you nose, as bleeding may occur. If you must sneeze, keep your mouth open to allow air to escape through your mouth.  If heavy bleeding occurs: Irrigate the nose with saline to rinse out clots, then spray the nose 3 - 4 times with Afrin Nasal Decongestant Spray.  The spray will constrict the blood vessels to slow bleeding.  Pinch the lower half of your nose shut to apply pressure, and lay down with your head elevated.  Ice packs over the nose may help as well. If bleeding persists despite these measures, you should notify your doctor.  Do not use the Afrin routinely to control nasal congestion after surgery, as it can result in worsening congestion and may affect healing.     Activity: Return to work varies among patients. Most patients will be   out of work at least 5 - 7 days to recover.  Patient may return to work after they are off of narcotic pain medication, and feeling well enough to perform the functions of their job.  Patients must avoid heavy lifting (over 10 pounds) or strenuous physical for 2 weeks after surgery, so your employer may need to assign you to light duty, or keep you out of work longer if light duty is not possible.  NOTE: you should not drive, operate dangerous machinery, do any mentally demanding tasks or make any important legal or financial decisions while on narcotic pain medication and recovering from the general anesthetic.    Call Your Doctor Immediately if You Have Any of the Following: 1. Bleeding that you cannot control with the above measures 2. Loss of vision, double vision, bulging of the eye or black eyes. 3. Fever over 101 degrees 4. Neck stiffness with  severe headache, fever, nausea and change in mental state. You are always encourage to call anytime with concerns, however, please call with requests for pain medication refills during office hours.  Office Endoscopy: During follow-up visits your doctor will remove any packing or splints that may have been placed and evaluate and clean your sinuses endoscopically.  Topical anesthetic will be used to make this as comfortable as possible, though you may want to take your pain medication prior to the visit.  How often this will need to be done varies from patient to patient.  After complete recovery from the surgery, you may need follow-up endoscopy from time to time, particularly if there is concern of recurrent infection or nasal polyps.  General Anesthesia, Adult, Care After This sheet gives you information about how to care for yourself after your procedure. Your health care provider may also give you more specific instructions. If you have problems or questions, contact your health care provider. What can I expect after the procedure? After the procedure, the following side effects are common:  Pain or discomfort at the IV site.  Nausea.  Vomiting.  Sore throat.  Trouble concentrating.  Feeling cold or chills.  Weak or tired.  Sleepiness and fatigue.  Soreness and body aches. These side effects can affect parts of the body that were not involved in surgery. Follow these instructions at home:  For at least 24 hours after the procedure:  Have a responsible adult stay with you. It is important to have someone help care for you until you are awake and alert.  Rest as needed.  Do not: ? Participate in activities in which you could fall or become injured. ? Drive. ? Use heavy machinery. ? Drink alcohol. ? Take sleeping pills or medicines that cause drowsiness. ? Make important decisions or sign legal documents. ? Take care of children on your own. Eating and drinking  Follow  any instructions from your health care provider about eating or drinking restrictions.  When you feel hungry, start by eating small amounts of foods that are soft and easy to digest (bland), such as toast. Gradually return to your regular diet.  Drink enough fluid to keep your urine pale yellow.  If you vomit, rehydrate by drinking water, juice, or clear broth. General instructions  If you have sleep apnea, surgery and certain medicines can increase your risk for breathing problems. Follow instructions from your health care provider about wearing your sleep device: ? Anytime you are sleeping, including during daytime naps. ? While taking prescription pain medicines, sleeping medicines, or medicines that   make you drowsy.  Return to your normal activities as told by your health care provider. Ask your health care provider what activities are safe for you.  Take over-the-counter and prescription medicines only as told by your health care provider.  If you smoke, do not smoke without supervision.  Keep all follow-up visits as told by your health care provider. This is important. Contact a health care provider if:  You have nausea or vomiting that does not get better with medicine.  You cannot eat or drink without vomiting.  You have pain that does not get better with medicine.  You are unable to pass urine.  You develop a skin rash.  You have a fever.  You have redness around your IV site that gets worse. Get help right away if:  You have difficulty breathing.  You have chest pain.  You have blood in your urine or stool, or you vomit blood. Summary  After the procedure, it is common to have a sore throat or nausea. It is also common to feel tired.  Have a responsible adult stay with you for the first 24 hours after general anesthesia. It is important to have someone help care for you until you are awake and alert.  When you feel hungry, start by eating small amounts of  foods that are soft and easy to digest (bland), such as toast. Gradually return to your regular diet.  Drink enough fluid to keep your urine pale yellow.  Return to your normal activities as told by your health care provider. Ask your health care provider what activities are safe for you. This information is not intended to replace advice given to you by your health care provider. Make sure you discuss any questions you have with your health care provider. Document Revised: 11/03/2017 Document Reviewed: 06/16/2017 Elsevier Patient Education  2020 [employer]  

## 2020-08-27 LAB — SARS CORONAVIRUS 2 (TAT 6-24 HRS): SARS Coronavirus 2: NEGATIVE

## 2020-08-28 ENCOUNTER — Other Ambulatory Visit: Payer: Self-pay

## 2020-08-28 ENCOUNTER — Ambulatory Visit: Payer: BC Managed Care – PPO | Admitting: Anesthesiology

## 2020-08-28 ENCOUNTER — Encounter: Payer: Self-pay | Admitting: Unknown Physician Specialty

## 2020-08-28 ENCOUNTER — Encounter
Admission: RE | Disposition: A | Discharge status: Home / Self Care | Payer: Self-pay | Source: Home / Self Care | Attending: Unknown Physician Specialty

## 2020-08-28 ENCOUNTER — Ambulatory Visit
Admission: RE | Admit: 2020-08-28 | Discharge: 2020-08-28 | Disposition: A | Discharge status: Home / Self Care | Payer: BC Managed Care – PPO | Attending: Unknown Physician Specialty | Admitting: Unknown Physician Specialty

## 2020-08-28 DIAGNOSIS — I10 Essential (primary) hypertension: Secondary | ICD-10-CM | POA: Insufficient documentation

## 2020-08-28 DIAGNOSIS — Z79899 Other long term (current) drug therapy: Secondary | ICD-10-CM | POA: Diagnosis not present

## 2020-08-28 DIAGNOSIS — M1990 Unspecified osteoarthritis, unspecified site: Secondary | ICD-10-CM | POA: Insufficient documentation

## 2020-08-28 DIAGNOSIS — J342 Deviated nasal septum: Secondary | ICD-10-CM | POA: Diagnosis not present

## 2020-08-28 DIAGNOSIS — J343 Hypertrophy of nasal turbinates: Secondary | ICD-10-CM | POA: Insufficient documentation

## 2020-08-28 DIAGNOSIS — J3489 Other specified disorders of nose and nasal sinuses: Secondary | ICD-10-CM | POA: Diagnosis present

## 2020-08-28 HISTORY — DX: Unspecified osteoarthritis, unspecified site: M19.90

## 2020-08-28 HISTORY — PX: NASAL TURBINATE REDUCTION: SHX2072

## 2020-08-28 HISTORY — DX: Essential (primary) hypertension: I10

## 2020-08-28 HISTORY — PX: SEPTOPLASTY: SHX2393

## 2020-08-28 SURGERY — SEPTOPLASTY, NOSE
Anesthesia: General | Site: Nose | Laterality: Bilateral

## 2020-08-28 MED ORDER — EPHEDRINE SULFATE 50 MG/ML IJ SOLN
INTRAMUSCULAR | Status: DC | PRN
  Administered 2020-08-28: 5 mg via INTRAVENOUS
  Administered 2020-08-28: 10 mg via INTRAVENOUS
  Administered 2020-08-28: 5 mg via INTRAVENOUS
  Administered 2020-08-28 (×2): 10 mg via INTRAVENOUS

## 2020-08-28 MED ORDER — BACITRACIN-NEOMYCIN-POLYMYXIN OINTMENT TUBE
TOPICAL_OINTMENT | CUTANEOUS | Status: DC | PRN
  Administered 2020-08-28: 1 via TOPICAL

## 2020-08-28 MED ORDER — OXYCODONE HCL 5 MG/5ML PO SOLN
5.0000 mg | Freq: Once | ORAL | Status: AC | PRN
  Administered 2020-08-28: 5 mg via ORAL

## 2020-08-28 MED ORDER — FENTANYL CITRATE (PF) 100 MCG/2ML IJ SOLN
25.0000 ug | INTRAMUSCULAR | Status: DC | PRN
  Administered 2020-08-28: 100 ug via INTRAVENOUS

## 2020-08-28 MED ORDER — ONDANSETRON HCL 4 MG/2ML IJ SOLN
4.0000 mg | Freq: Once | INTRAMUSCULAR | Status: AC | PRN
  Administered 2020-08-28: 4 mg via INTRAVENOUS

## 2020-08-28 MED ORDER — HYDROCODONE-ACETAMINOPHEN 5-300 MG PO TABS: 1.0000 | ORAL_TABLET | ORAL | 0 refills | Status: DC | PRN

## 2020-08-28 MED ORDER — GLYCOPYRROLATE 0.2 MG/ML IJ SOLN
INTRAMUSCULAR | Status: DC | PRN
  Administered 2020-08-28: .1 mg via INTRAVENOUS

## 2020-08-28 MED ORDER — SUCCINYLCHOLINE CHLORIDE 20 MG/ML IJ SOLN
INTRAMUSCULAR | Status: DC | PRN
  Administered 2020-08-28: 100 mg via INTRAVENOUS

## 2020-08-28 MED ORDER — DEXAMETHASONE SODIUM PHOSPHATE 4 MG/ML IJ SOLN
INTRAMUSCULAR | Status: DC | PRN
  Administered 2020-08-28: 10 mg via INTRAVENOUS

## 2020-08-28 MED ORDER — KETOROLAC TROMETHAMINE 30 MG/ML IJ SOLN: 15.0000 mg | Freq: Once | INTRAMUSCULAR | Status: DC | PRN

## 2020-08-28 MED ORDER — OXYMETAZOLINE HCL 0.05 % NA SOLN
6.0000 | Freq: Once | NASAL | Status: AC
  Administered 2020-08-28: 6 via NASAL

## 2020-08-28 MED ORDER — PHENYLEPHRINE HCL 0.5 % NA SOLN: NASAL | Status: DC | PRN

## 2020-08-28 MED ORDER — OXYCODONE HCL 5 MG PO TABS: 5.0000 mg | ORAL_TABLET | Freq: Once | ORAL | Status: AC | PRN

## 2020-08-28 MED ORDER — PROPOFOL 10 MG/ML IV BOLUS
INTRAVENOUS | Status: DC | PRN
  Administered 2020-08-28: 160 mg via INTRAVENOUS

## 2020-08-28 MED ORDER — MIDAZOLAM HCL 5 MG/5ML IJ SOLN
INTRAMUSCULAR | Status: DC | PRN
  Administered 2020-08-28: 2 mg via INTRAVENOUS

## 2020-08-28 MED ORDER — LIDOCAINE HCL (CARDIAC) PF 100 MG/5ML IV SOSY
PREFILLED_SYRINGE | INTRAVENOUS | Status: DC | PRN
  Administered 2020-08-28: 50 mg via INTRAVENOUS

## 2020-08-28 MED ORDER — LACTATED RINGERS IV SOLN: INTRAVENOUS | Status: DC

## 2020-08-28 MED ORDER — LIDOCAINE-EPINEPHRINE 1 %-1:100000 IJ SOLN
INTRAMUSCULAR | Status: DC | PRN
  Administered 2020-08-28: 10 mL

## 2020-08-28 MED ORDER — SULFAMETHOXAZOLE-TRIMETHOPRIM 800-160 MG PO TABS: 1.0000 | ORAL_TABLET | Freq: Two times a day (BID) | ORAL | 0 refills | Status: DC

## 2020-08-28 SURGICAL SUPPLY — 22 items
COAG SUCT 10F 3.5MM HAND CTRL (MISCELLANEOUS) ×2 IMPLANT
DRAPE HEAD BAR (DRAPES) ×2 IMPLANT
DRESSING NASL FOAM PST OP SINU (MISCELLANEOUS) IMPLANT
DRSG NASAL FOAM POST OP SINU (MISCELLANEOUS) ×4
ELECT REM PT RETURN 9FT ADLT (ELECTROSURGICAL) ×2
ELECTRODE REM PT RTRN 9FT ADLT (ELECTROSURGICAL) ×1 IMPLANT
GLOVE BIO SURGEON STRL SZ7.5 (GLOVE) ×4 IMPLANT
HANDLE YANKAUER SUCT BULB TIP (MISCELLANEOUS) ×2 IMPLANT
KIT TURNOVER KIT A (KITS) ×2 IMPLANT
NDL HYPO 25GX1X1/2 BEV (NEEDLE) ×1 IMPLANT
NEEDLE HYPO 25GX1X1/2 BEV (NEEDLE) ×2 IMPLANT
PACK ENT CUSTOM (PACKS) ×2 IMPLANT
SPLINT NASAL SEPTAL BLV .25 LG (MISCELLANEOUS) ×1 IMPLANT
SPONGE NEURO XRAY DETECT 1X3 (DISPOSABLE) ×2 IMPLANT
STRAP BODY AND KNEE 60X3 (MISCELLANEOUS) ×2 IMPLANT
SUT CHROMIC 3-0 (SUTURE) ×2
SUT CHROMIC 3-0 KS 27XMFL CR (SUTURE) ×1
SUT ETHILON 3-0 KS 30 BLK (SUTURE) ×2 IMPLANT
SUTURE CHRMC 3-0 KS 27XMFL CR (SUTURE) ×1 IMPLANT
SYR 10ML LL (SYRINGE) ×2 IMPLANT
TOWEL OR 17X26 4PK STRL BLUE (TOWEL DISPOSABLE) ×2 IMPLANT
WATER STERILE IRR 250ML POUR (IV SOLUTION) ×2 IMPLANT

## 2020-08-28 NOTE — Transfer of Care (Signed)
Immediate Anesthesia Transfer of Care Note  Patient: Eric Vance  Procedure(s) Performed: SEPTOPLASTY (Bilateral Nose) TURBINATE REDUCTION/SUBMUCOSAL RESECTION (Bilateral Nose)  Patient Location: PACU  Anesthesia Type: General  Level of Consciousness: awake, alert  and patient cooperative  Airway and Oxygen Therapy: Patient Spontanous Breathing and Patient connected to supplemental oxygen  Post-op Assessment: Post-op Vital signs reviewed, Patient's Cardiovascular Status Stable, Respiratory Function Stable, Patent Airway and No signs of Nausea or vomiting  Post-op Vital Signs: Reviewed and stable  Complications: No complications documented.

## 2020-08-28 NOTE — Anesthesia Procedure Notes (Signed)
Procedure Name: Intubation Date/Time: 08/28/2020 8:28 AM Performed by: Mayme Genta, CRNA Pre-anesthesia Checklist: Patient identified, Emergency Drugs available, Suction available, Patient being monitored and Timeout performed Patient Re-evaluated:Patient Re-evaluated prior to induction Oxygen Delivery Method: Circle system utilized Preoxygenation: Pre-oxygenation with 100% oxygen Induction Type: IV induction Ventilation: Mask ventilation without difficulty Laryngoscope Size: Miller and 3 Grade View: Grade I Tube type: Oral Rae Tube size: 7.5 mm Number of attempts: 1 Placement Confirmation: ETT inserted through vocal cords under direct vision,  positive ETCO2 and breath sounds checked- equal and bilateral Tube secured with: Tape Dental Injury: Teeth and Oropharynx as per pre-operative assessment

## 2020-08-28 NOTE — Op Note (Signed)
PREOPERATIVE DIAGNOSIS:  Chronic nasal obstruction.  POSTOPERATIVE DIAGNOSIS:  Chronic nasal obstruction.  SURGEON:  Roena Malady, M.D.  NAME OF PROCEDURE:  1. Nasal septoplasty. 2. Submucous resection of inferior turbinates.  OPERATIVE FINDINGS:  Severe nasal septal deformity, hypertrophy of the inferior turbinates.   DESCRIPTION OF THE PROCEDURE:  Eric Vance was identified in the holding area and taken to the operating room and placed in the supine position.  After general endotracheal anesthesia was induced, the table was turned 45 degrees and the patient was placed in a semi-Fowler position.  The nose was then topically anesthetized with Lidocaine, cotton pledgets were placed within each nostril. After approximately 5 minutes, this was removed at which time a local anesthetic of 1% Lidocaine 1:100,000 units of Epinephrine was used to inject the inferior turbinates in the nasal septum. A total of 10 ml was used. Examination of the nose showed a severe left nasal septal deformity and tremendous hypertrophied inferior turbinate.  Beginning on the right hand side a hemitransfixion incision was then created on the leading edge of the septum on the right.  A subperichondrial plane was elevated posteriorly on the left and taken back to the perpendicular plate of the ethmoid where subperiosteal plane was elevated posteriorly on the left. A large septal spur was identified on the left hand side impacting on the inferior turbinate.  An inferior rim of cartilage was removed anteriorly with care taken to leave an anterior strut to prevent nasal collapse. With this strut removed the perpendicular plate of the ethmoid was separated from the quadrangular cartilage. The large septal spur was removed.  The septum was then replaced in the midline. Reinspection through each nostril showed excellent reduction of the septal deformity. A left posterior inferior fenestration was then created to allow hematoma  drainage.  With the septoplasty completed, beginning on the left-hand side, a 15 blade was used to incise along the inferior edge of the inferior turbinate. A superior laterally based flap was then elevated. The underlying conchal bone of mucosa was excised using Knight scissors. The flap was then laid back over the turbinate stump and cauterized using suction cautery. In a similar fashion the submucous resection was performed on the right.  With the submucous resection completed bilaterally and no active bleeding, the hemitransfixion incision was then closed using two interrupted 3-0 chromic sutures.  Plastic nasal septal splints were placed within each nostril and affixed to the septum using a 3-0 nylon suture. Stammberger was then used beneath each inferior turbinate for hemostasis.    The patient tolerated the procedure well, was returned to anesthesia, extubated in the operating room, and taken to the recovery room in stable condition.    CULTURES:  None.  SPECIMENS:  None.  ESTIMATED BLOOD LOSS:  25 cc.  Roena Malady  08/28/2020  9:07 AM

## 2020-08-28 NOTE — H&P (Signed)
The patient's history has been reviewed, patient examined, no change in status, stable for surgery.  Questions were answered to the patients satisfaction.  

## 2020-08-28 NOTE — Anesthesia Postprocedure Evaluation (Signed)
Anesthesia Post Note  Patient: Eric Vance  Procedure(s) Performed: SEPTOPLASTY (Bilateral Nose) TURBINATE REDUCTION/SUBMUCOSAL RESECTION (Bilateral Nose)     Patient location during evaluation: PACU Anesthesia Type: General Level of consciousness: awake and alert Pain management: pain level controlled Vital Signs Assessment: post-procedure vital signs reviewed and stable Respiratory status: spontaneous breathing, nonlabored ventilation, respiratory function stable and patient connected to nasal cannula oxygen Cardiovascular status: blood pressure returned to baseline and stable Postop Assessment: no apparent nausea or vomiting Anesthetic complications: no   No complications documented.  Sinda Du

## 2020-08-28 NOTE — Anesthesia Preprocedure Evaluation (Signed)
Anesthesia Evaluation  Patient identified by MRN, date of birth, ID band Patient awake    Reviewed: Allergy & Precautions, NPO status , Patient's Chart, lab work & pertinent test results  Airway Mallampati: II  TM Distance: >3 FB Neck ROM: Full    Dental no notable dental hx. (+) Teeth Intact, Dental Advisory Given   Pulmonary neg pulmonary ROS,    Pulmonary exam normal breath sounds clear to auscultation       Cardiovascular Exercise Tolerance: Good hypertension, negative cardio ROS Normal cardiovascular exam Rhythm:Regular Rate:Normal     Neuro/Psych  Neuromuscular disease negative psych ROS   GI/Hepatic negative GI ROS, Neg liver ROS,   Endo/Other  negative endocrine ROS  Renal/GU negative Renal ROS  negative genitourinary   Musculoskeletal  (+) Arthritis ,   Abdominal Normal abdominal exam  (+) - obese,  Abdomen: soft.    Peds negative pediatric ROS (+)  Hematology negative hematology ROS (+)   Anesthesia Other Findings   Reproductive/Obstetrics negative OB ROS                             Anesthesia Physical Anesthesia Plan  ASA: II  Anesthesia Plan: General   Post-op Pain Management:    Induction: Intravenous  PONV Risk Score and Plan: 2 and Treatment may vary due to age or medical condition  Airway Management Planned: Oral ETT  Additional Equipment:   Intra-op Plan:   Post-operative Plan: Extubation in OR  Informed Consent: I have reviewed the patients History and Physical, chart, labs and discussed the procedure including the risks, benefits and alternatives for the proposed anesthesia with the patient or authorized representative who has indicated his/her understanding and acceptance.     Dental advisory given  Plan Discussed with: CRNA, Anesthesiologist and Surgeon  Anesthesia Plan Comments:         Anesthesia Quick Evaluation  Patient Active  Problem List   Diagnosis Date Noted  . Other insomnia 09/18/2019  . Anxiety 08/01/2019  . History of chronic pain 08/01/2019  . History of gastritis 08/01/2019  . Low vitamin D level 08/01/2019  . Recurrent UTI 08/01/2019  . Nocturia 08/01/2019  . Pain in joints of right hand 01/31/2018  . Traumatic rupture of collateral ligament of right index finger 11/27/2015  . Vitamin D deficiency 08/12/2014  . Right anterior knee pain 08/07/2014  . Patellar tendinitis of right knee 06/13/2012  . Patellar tendon rupture 04/12/2012  . 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 PES PLANUS 08/16/2010  . ABNORMALITY OF GAIT 08/16/2010  . KNEE PAIN, BILATERAL 07/14/2010    CBC Latest Ref Rng & Units 09/16/2010  WBC 4.0 - 10.5 10*3/microliter 5.9  Hemoglobin 13.0 - 17.0 g/dL 14.5  Hematocrit 39.0 - 52.0 % 43.6  Platelets 150 - 400 K/uL 221   No flowsheet data found.  Risks and benefits of anesthesia discussed at length, patient or surrogate demonstrates understanding. Appropriately NPO. Plan to proceed with anesthesia.  Champ Mungo, MD 08/28/20

## 2020-08-31 ENCOUNTER — Encounter: Payer: Self-pay | Admitting: Unknown Physician Specialty

## 2020-09-04 ENCOUNTER — Other Ambulatory Visit: Payer: Self-pay | Admitting: Neurology

## 2020-09-04 DIAGNOSIS — R292 Abnormal reflex: Secondary | ICD-10-CM

## 2020-09-04 DIAGNOSIS — G608 Other hereditary and idiopathic neuropathies: Secondary | ICD-10-CM

## 2020-09-07 ENCOUNTER — Ambulatory Visit: Payer: Self-pay | Admitting: Urology

## 2020-09-25 ENCOUNTER — Ambulatory Visit
Admission: RE | Admit: 2020-09-25 | Discharge: 2020-09-25 | Disposition: A | Discharge status: Home / Self Care | Payer: BC Managed Care – PPO | Source: Ambulatory Visit | Attending: Neurology | Admitting: Neurology

## 2020-09-25 ENCOUNTER — Other Ambulatory Visit: Payer: Self-pay

## 2020-09-25 DIAGNOSIS — G608 Other hereditary and idiopathic neuropathies: Secondary | ICD-10-CM

## 2020-09-25 DIAGNOSIS — R292 Abnormal reflex: Secondary | ICD-10-CM

## 2020-09-25 MED ORDER — GADOBENATE DIMEGLUMINE 529 MG/ML IV SOLN
15.0000 mL | Freq: Once | INTRAVENOUS | Status: AC | PRN
  Administered 2020-09-25: 15 mL via INTRAVENOUS

## 2020-10-05 ENCOUNTER — Other Ambulatory Visit: Payer: Self-pay

## 2020-10-05 ENCOUNTER — Ambulatory Visit: Payer: BC Managed Care – PPO | Admitting: Urology

## 2020-10-05 VITALS — BP 136/92 | HR 94 | Wt 161.0 lb

## 2020-10-05 DIAGNOSIS — N3946 Mixed incontinence: Secondary | ICD-10-CM | POA: Diagnosis not present

## 2020-10-05 NOTE — Progress Notes (Signed)
10/05/2020 1:57 PM   Theda Sers [birth year] [id number]  Referring provider: Dion Body, MD [facility] [place],  [place] [address]  Chief Complaint  Patient presents with  . Urinary Incontinence    HPI: Dr Francella Solian: positive urine culture from incompletely treated prostatitis. Cystoscopy February 2021 normal. Failed Flomax. Being evaluated for possible neuropathy.Has erectile dysfunction given tadalafil. Testosterone level low normal  For approximately 1 year patient has complicated voiding dysfunction in description. He can leak 2-5 times within an hour 1-6 times a day. It can be well before or after urination. He can leak with coughing sneezing and bending lifting but not every occasion. Bending over to tie shoes he would leak. Once or twice a month he leaks a small amount sleeping. He actually normally does not have urge incontinence. After he eats a meal he can have urgency 3 or 4 times in a row. He voids 5 times a day and gets up at least once a night. He leaks without awareness. He changes his underclothes but does not wear a pad  In the last 6 months his flow has worsened. 60% of the time flow good. Other times his flow will start but he can sit and push for up to 15 minutes in order to empty his bladder.  He has been treated for 1 bladder infection. 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 I do not feel he ever had an MRI due to cost concern. He has no loss of sensation around the scrotum or perineum  Patient leaks without awareness. Sometimes he gets urgency after a meal. His flow symptoms are worsening and he sits and urinates many times over several minutes. He can leak a small amount while he sleeping. He says he leaks definitely when he leans forward to tie his shoe.  He is uncommon bedwetting   He understands that his symptoms do not really fit a pattern. We will also be looking for evidence of neurogenic bladder with high pressures. We will look for the evidence of bladder neck dyssynergia and we will also look for the evidence of external sphincter dyssynergia. Hopefully he will be able to void in the lab setting. He said he can date some of his leaking back 15 years.  He has very discrete pain in the left leg; some forearm symptoms and some knee symptoms but they have not sorted out a firm neurologic diagnosis or rheumatoid arthritis diagnosis etc.   On urodynamics patient had not voided and was catheterized for 100 mL. Maximum bladder capacity 650 mL. He had some lower abdominal discomfort at 455 mL. Bladder was stable. He did note that coughing he thinks triggered his urgency. No stress incontinence with Valsalva pressure 128 cm of water. He tried for many minutes to void without straining and could not. He had to strain finally triggered the contraction that he voided off. He said he often strains to void and will have a bowel movement when voiding. He voided 425 mL with max flow 30 mils per second. Max voiding pressure 20 cm water. Residual 228 mL. EMG activity increased during the voiding phase from straining. He is walking with a limp and having nerve issues in his leg. He was recently put on prednisone.He had a prolonged interrupted pattern and did have trouble voiding in  the lab setting. He had no loss of bladder compliance. Smooth-walled bladder no Christmas tree.   Patient's presentation is very much out of the ordinary. Based on urodynamics and presentation in my opinion he does not have stress incontinence. I believe when he leaks when he is bending forward or leaks awareness is due to overactivity. He can empty efficiently. His flow was likely due to a poorly contractile bladder. He did not have obvious external sphincter  dyssynergia but was straining during the voiding phase. He is a low pressure system  The role of overactive bladder medications and beta 3 agonist discussed. May make voiding phase worse at times. Neuromodulation treatment such as InterStim is an option.  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 He said his flow symptoms are worsening. Last Friday he was 10 or 12 hours before he could void. This is the first time this happened. His leaking is from overactivity that he does not feel. He understands the options noted. He is not can learn how to self catheterize at this stage. He understands the beta 3 agonist could worsen his symptoms. Reassess on Myrbetriq25 mg samples and prescription and reassess in 6 weeks with a residual.He does have more neurologic testing coming  It must be frustrating having a symptoms with some unknown diagnosis. He understands conceptually about InterStim but certainly we would not do this at this stage of the condition. He understands the urodynamics support the diagnosis of neurogenic bladder but are not definitive  Today's residual 0 mL Patient said for 3 weeks he did not leak without awareness or with bending and it was completely dry.  Now a 70% better.  It did not slow down his flow.  3 days he did have some trouble to urinate on different days but overall flow has been consistent and unchanged.  Clinically not infected  He will try the new beta 3 agonist for 1 month with samples co-pay card and prescription.  I will see him back in 7 weeks.  He will go back on the Myrbetriq at home or when I see him if it does not work as well  Today His leakage with awareness is dramatically better.  20% of time he might leak a little bit if he leans over and ties issue.  Flow stills comes and goes but is stable.  Clinically not infected.  They thought he had small neuron disease but the biopsy of the skin was negative so the referring her  to [facility]  Clinically not infected       PMH: Past Medical History:  Diagnosis Date  . Arthritis    lower back  . Hypertension   . Lupus ([place])   . Seasonal allergies     Surgical History: Past Surgical History:  Procedure Laterality Date  . MOUTH SURGERY    . NASAL TURBINATE REDUCTION Bilateral 08/28/2020   Procedure: TURBINATE REDUCTION/SUBMUCOSAL RESECTION;  Surgeon: Beverly Gust, MD;  Location: [facility];  Service: ENT;  Laterality: Bilateral;  . SEPTOPLASTY Bilateral 08/28/2020   Procedure: SEPTOPLASTY;  Surgeon: Beverly Gust, MD;  Location: [facility];  Service: ENT;  Laterality: Bilateral;    Home Medications:  Allergies as of 10/05/2020      Reactions   Diclofenac Itching, Rash      Medication List       Accurate as of October 05, 2020  1:57 PM. If you have any questions, ask your nurse or doctor.  busPIRone 5 MG tablet Commonly known as: BUSPAR Take 5 mg by mouth 2 (two) times daily.   DULoxetine 30 MG capsule Commonly known as: CYMBALTA Take by mouth.   fexofenadine 180 MG tablet Commonly known as: ALLEGRA Take by mouth.   Flonase Sensimist 27.5 MCG/SPRAY nasal spray Generic drug: fluticasone Place into the nose.   gabapentin 300 MG capsule Commonly known as: NEURONTIN gabapentin 300 mg capsule  TAKE 1 TABLET BY MOUTH ONCE DAILY FOR 7 DAYS. THEN TAKE 2 TIMES A DAY AFTER THAT   Gemtesa 75 MG Tabs Generic drug: Vibegron Take 75 mg by mouth daily.   hydrochlorothiazide 12.5 MG tablet Commonly known as: HYDRODIURIL Take 12.5 mg by mouth daily.   HYDROcodone-Acetaminophen 5-300 MG Tabs Take 1-2 tablets by mouth every 4 (four) hours as needed.   meloxicam 15 MG tablet Commonly known as: MOBIC   mirabegron ER 25 MG Tb24 tablet Commonly known as: MYRBETRIQ Take 1 tablet (25 mg total) by mouth daily.   montelukast 10 MG tablet Commonly known as: SINGULAIR Take by mouth.   MULTIVITAMIN MEN PO Take by  mouth daily.   sulfamethoxazole-trimethoprim 800-160 MG tablet Commonly known as: BACTRIM DS Take 1 tablet by mouth 2 (two) times daily.   tadalafil 20 MG tablet Commonly known as: CIALIS TAKE 1 TABLET 1 HOUR PRIOR TO INTERCOURSE   traZODone 50 MG tablet Commonly known as: DESYREL Take by mouth.       Allergies:  Allergies  Allergen Reactions  . Diclofenac Itching and Rash    Family History: Family History  Problem Relation Age of Onset  . Hypertension Father     Social History:  reports that he has never smoked. He has never used smokeless tobacco. He reports current alcohol use of about 8.0 standard drinks of alcohol per week. He reports that he does not use drugs.  ROS:                                        Physical Exam: BP (!) 136/92   Pulse 94   Wt 161 lb (73 kg)   BMI 23.10 kg/m   Constitutional:  Alert and oriented, No acute distress. HEENT: [place] AT, moist mucus membranes.  Trachea midline, no masses.  Laboratory Data: Lab Results  Component Value Date   WBC 5.9 09/16/2010   HGB 14.5 09/16/2010   HCT 43.6 09/16/2010   MCV 94.6 09/16/2010   PLT 221 09/16/2010    No results found for: CREATININE  No results found for: PSA  Lab Results  Component Value Date   TESTOSTERONE 322 04/24/2020    No results found for: HGBA1C  Urinalysis    Component Value Date/Time   APPEARANCEUR Clear 05/11/2020 1439   GLUCOSEU Negative 05/11/2020 1439   BILIRUBINUR Negative 05/11/2020 1439   PROTEINUR Negative 05/11/2020 1439   NITRITE Negative 05/11/2020 1439   LEUKOCYTESUR Negative 05/11/2020 1439    Pertinent Imaging:   Assessment & Plan: Reassess with digital in 6 months.  He knows that theoretically InterStim might help but certainly is not a perfect treatment and he is doing much better on the Myrbetriq.  He agreed to conservative specially in the midst of this pending neurologic issue  There are no diagnoses linked to this  encounter.  No follow-ups on file.  Reece Packer, MD  [facility] [address] [place], [place]  [address] [phone]  

## 2021-03-17 DIAGNOSIS — N528 Other male erectile dysfunction: Secondary | ICD-10-CM | POA: Insufficient documentation

## 2021-04-05 ENCOUNTER — Ambulatory Visit: Payer: Self-pay | Admitting: Urology

## 2021-05-31 ENCOUNTER — Other Ambulatory Visit: Payer: Self-pay

## 2021-05-31 ENCOUNTER — Ambulatory Visit: Payer: BC Managed Care – PPO | Admitting: Urology

## 2021-05-31 ENCOUNTER — Encounter: Payer: Self-pay | Admitting: Urology

## 2021-05-31 VITALS — BP 115/72 | HR 105

## 2021-05-31 DIAGNOSIS — N3946 Mixed incontinence: Secondary | ICD-10-CM | POA: Diagnosis not present

## 2021-05-31 LAB — BLADDER SCAN AMB NON-IMAGING: Scan Result: 90

## 2021-05-31 MED ORDER — MIRABEGRON ER 25 MG PO TB24: 25.0000 mg | ORAL_TABLET | Freq: Every day | ORAL | 3 refills | Status: DC

## 2021-05-31 NOTE — Progress Notes (Signed)
05/31/2021 3:02 PM   Eric Vance 19-May-1986 [id number]  Referring provider: Dion Body, MD [facility],  [place] [address]  Chief Complaint  Patient presents with   Urinary Incontinence    HPI: Dr Francella Solian: positive urine culture from incompletely treated prostatitis.  Cystoscopy February 2021 normal.  Failed Flomax.  Being evaluated for possible neuropathy.     For approximately 1 year patient has complicated voiding dysfunction in description.  He can leak 2-5 times within an hour 1-6 times a day.  It can be well before or after urination.  He can leak with coughing sneezing and bending lifting but not every occasion.  Bending over to tie shoes he would leak.  Once or twice a month he leaks a small amount sleeping.  He actually normally does not have urge incontinence.  After he eats a meal he can have urgency 3 or 4 times in a row.  He voids 5 times a day and gets up at least once a night.  He leaks without awareness.  He changes his underclothes but does not wear a pad   In the last 6 months his flow has worsened.  60% of the time flow good.  Other times his flow will start but he can sit and push for up to 15 minutes in order to empty his bladder.   He has been treated for 1 bladder infection.  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  I do not feel he ever had an MRI due to cost concern.  He has no loss of sensation around the scrotum or perineum   No stigmata spinal difficulty normal sensation of perineal area and scrotum   Patient leaks without awareness.  Sometimes he gets urgency after a meal.  His flow symptoms are worsening and he sits and urinates many times over several minutes.  He can leak a small amount while he sleeping.  He says he leaks definitely when he leans forward to tie his  shoe.  He is uncommon bedwetting   He understands that his symptoms do not really fit a pattern.  We will also be looking for evidence of neurogenic bladder with high pressures.  We will look for the evidence of bladder neck dyssynergia and we will also look for the evidence of external sphincter dyssynergia.  Hopefully he will be able to void in the lab setting.  He said he can date some of his leaking back 15 years.   He has very discrete pain in the left leg; some forearm symptoms and some knee symptoms but they have not sorted out a firm neurologic diagnosis or rheumatoid arthritis diagnosis etc.   On urodynamics patient had not voided and was catheterized for 100 mL.  Maximum bladder capacity 650 mL.  He had some lower abdominal discomfort at 455 mL.  Bladder was stable.  He did note that coughing he thinks triggered his urgency.  No stress incontinence with Valsalva pressure 128 cm of water.  He tried for many minutes to void without straining and could not.  He had to strain finally triggered the contraction that he voided off.  He said he often strains to void and will have a bowel movement when voiding.  He voided 425 mL with max flow 30 mils per second.  Max voiding pressure 20 cm water.  Residual 228 mL.  EMG activity increased during the voiding phase from straining.  He is walking with a limp and having nerve issues in his leg.  He was recently put on prednisone.  He had a prolonged interrupted pattern and did have trouble voiding in the lab setting.  He had no loss of bladder compliance.  Smooth-walled bladder no Christmas tree.     Patient's presentation is very much out of the ordinary.  Based on urodynamics and presentation in my opinion he does not have stress incontinence.  I believe when he leaks when he is bending forward or leaks awareness is due to overactivity.  He can empty efficiently.  His flow was likely due to a poorly contractile bladder.  He did not have obvious external  sphincter dyssynergia but was straining during the voiding phase.  He is a low pressure system   The role of overactive bladder medications in the beta 3 agonist discussed.  May make voiding phase worse at times.  Neuromodulation treatment such as InterStim is an option.   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  He said his flow symptoms are worsening.  Last Friday he was 10 or 12 hours before he could void.  This is the first time this happened.  His leaking is from overactivity that he does not feel.  He understands the options noted.  He is not can learn how to self catheterize at this stage.  He understands the beta 3 agonist could worsen his symptoms.  Reassess on Myrbetriq 25 mg samples and prescription and reassess in 6 weeks with a residual.  He does have more neurologic testing coming   It must be frustrating having a symptoms with some unknown diagnosis.  He understands conceptually about InterStim but certainly we would not do this at this stage of the condition.  He understands the urodynamics support the diagnosis of neurogenic bladder but are not definitive   Today's residual 0 mL Patient said for 3 weeks he did not leak without awareness or with bending and it was completely dry.  Now a 70% better.  It did not slow down his flow.  3 days he did have some trouble to urinate on different days but overall flow has been consistent and unchanged.  Clinically not infected    He will try the new beta 3 agonist for 1 month with samples co-pay card and prescription.  I will see him back in 7 weeks.  He will go back on the Myrbetriq at home or when I see him if it does not work as well  Today Up until 6 weeks ago Myrbetriq was working [employer].  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  Testing can be done there.  He failed the other beta 3 agonist.  In the last few weeks he is under a lot of stress and  is leaking more and having less signal to go.  He is having a little more trouble empty  Today residual 90 mL   PMH: Past Medical History:  Diagnosis Date   Arthritis    lower back   Hypertension    Lupus ([place])    Seasonal allergies     Surgical History: Past Surgical History:  Procedure Laterality Date   MOUTH SURGERY     NASAL TURBINATE REDUCTION Bilateral 08/28/2020   Procedure:  TURBINATE REDUCTION/SUBMUCOSAL RESECTION;  Surgeon: Beverly Gust, MD;  Location: [place];  Service: ENT;  Laterality: Bilateral;   SEPTOPLASTY Bilateral 08/28/2020   Procedure: SEPTOPLASTY;  Surgeon: Beverly Gust, MD;  Location: [place];  Service: ENT;  Laterality: Bilateral;    Home Medications:  Allergies as of 05/31/2021       Reactions   Diclofenac Itching, Rash        Medication List        Accurate as of May 31, 2021  3:02 PM. If you have any questions, ask your nurse or doctor.          STOP taking these medications    gabapentin 300 MG capsule Commonly known as: NEURONTIN Stopped by: Reece Packer, MD       TAKE these medications    baclofen 10 MG tablet Commonly known as: LIORESAL Take 10 mg by mouth 2 (two) times daily.   busPIRone 10 MG tablet Commonly known as: BUSPAR Take 1 tablet by mouth 2 (two) times daily.   DULoxetine 60 MG capsule Commonly known as: CYMBALTA Take 60 mg by mouth daily.   fexofenadine 180 MG tablet Commonly known as: ALLEGRA Take by mouth.   fluticasone 27.5 MCG/SPRAY nasal spray Commonly known as: VERAMYST Place into the nose.   hydrochlorothiazide 12.5 MG tablet Commonly known as: HYDRODIURIL Take 12.5 mg by mouth daily.   meloxicam 15 MG tablet Commonly known as: MOBIC   mirabegron ER 25 MG Tb24 tablet Commonly known as: MYRBETRIQ Take 1 tablet (25 mg total) by mouth daily.   montelukast 10 MG tablet Commonly known as: SINGULAIR Take 1 tablet by mouth daily.   MULTIVITAMIN MEN  PO Take by mouth daily.   pregabalin 50 MG capsule Commonly known as: LYRICA Take 100 mg by mouth 2 (two) times daily.   tadalafil 20 MG tablet Commonly known as: CIALIS Take by mouth.   traZODone 50 MG tablet Commonly known as: DESYREL Take by mouth.        Allergies:  Allergies  Allergen Reactions   Diclofenac Itching and Rash    Family History: Family History  Problem Relation Age of Onset   Hypertension Father     Social History:  reports that he has never smoked. He has never used smokeless tobacco. He reports current alcohol use of about 8.0 standard drinks of alcohol per week. He reports that he does not use drugs.  ROS:                                        Physical Exam: There were no vitals taken for this visit.  Constitutional:  Alert and oriented, No acute distress. HEENT: [place] AT, moist mucus membranes.  Trachea midline, no masses.  Laboratory Data: Lab Results  Component Value Date   WBC 5.9 09/16/2010   HGB 14.5 09/16/2010   HCT 43.6 09/16/2010   MCV 94.6 09/16/2010   PLT 221 09/16/2010    No results found for: CREATININE  No results found for: PSA  Lab Results  Component Value Date   TESTOSTERONE 322 04/24/2020    No results found for: HGBA1C  Urinalysis    Component Value Date/Time   APPEARANCEUR Clear 05/11/2020 1439   GLUCOSEU Negative 05/11/2020 1439   BILIRUBINUR Negative 05/11/2020 1439   PROTEINUR Negative 05/11/2020 1439   NITRITE Negative 05/11/2020 1439   LEUKOCYTESUR Negative 05/11/2020  1439    Pertinent Imaging:   Assessment & Plan: Recognizing there is not an easy option and certainly were not can do InterStim in the face of this ongoing diagnostic issue.  He agreed.  90x3 sent to pharmacy and will see in a year and as needed  1. Mixed incontinence  - BLADDER SCAN AMB NON-IMAGING   No follow-ups on file.  Reece Packer, MD  [facility] [address] [place], [place] [address] [phone]

## 2021-06-09 ENCOUNTER — Other Ambulatory Visit: Payer: Self-pay

## 2021-06-09 MED ORDER — MIRABEGRON ER 25 MG PO TB24: 25.0000 mg | ORAL_TABLET | Freq: Every day | ORAL | 3 refills | Status: DC

## 2021-06-09 NOTE — Telephone Encounter (Signed)
Patient called requesting myrberiq script be sent into [facility] pharmacy. [facility] does not participate with coupon program. Script done

## 2021-07-02 IMAGING — MR MR CERVICAL SPINE WO/W CM
7 of 8 series · 33 of 48 positions shown · IV contrast (multihance)
Comparison: None.

CLINICAL DATA: Idiopathic small fiber sensory neuropathy; hyper
reflexia.

EXAM:
MRI CERVICAL SPINE WITHOUT AND WITH CONTRAST
TECHNIQUE: Multiplanar and multiecho pulse sequences of the cervical spine, to
include the craniocervical junction and cervicothoracic junction,
were obtained without and with intravenous contrast.
CONTRAST:  15mL MULTIHANCE GADOBENATE DIMEGLUMINE 529 MG/ML IV SOLN

[Series 2: STIR · sagittal · 3.0mm · 0.82mm/px · 4 of 15 slices shown]
[im 1/15]
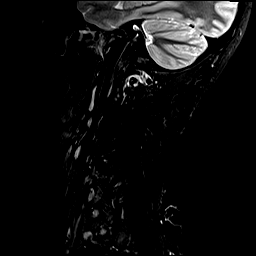
[im 5/15]
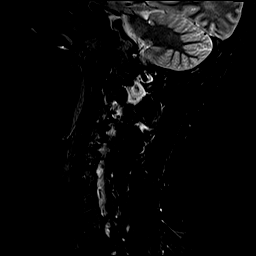
[im 10/15]
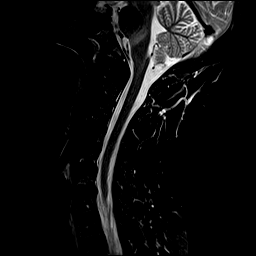
[im 15/15]
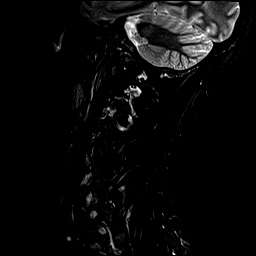

[Series 3: T1 · sagittal · 3.0mm · 0.82mm/px · 4 of 15 slices shown (1 of 2)]
[im 1/15]
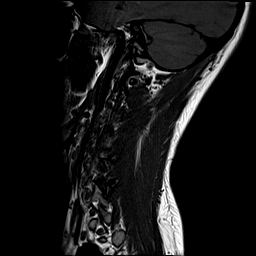
[im 5/15]
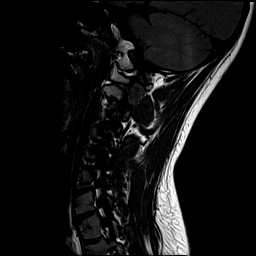
[im 10/15]
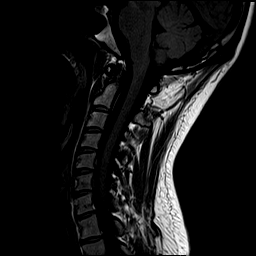
[im 15/15]
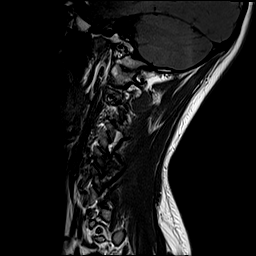

[Series 5: T2 · axial · 3.0mm · 0.70mm/px · z∈[-108,-1]mm · 8 of 28 slices shown (1 of 2)]
[im 1/28]
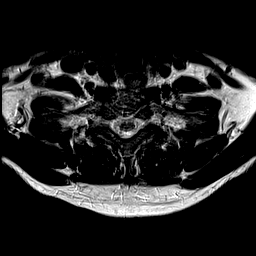
[im 4/28]
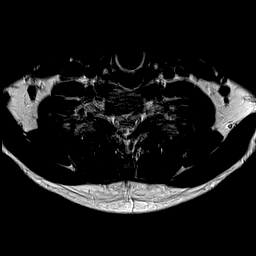
[im 8/28]
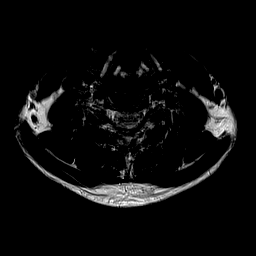
[im 12/28]
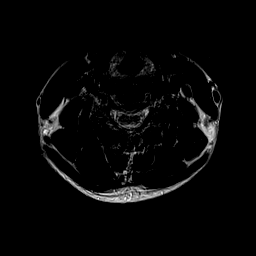
[im 16/28]
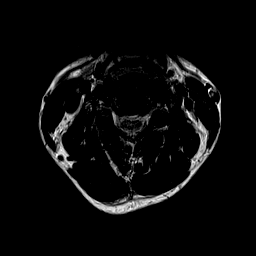
[im 20/28]
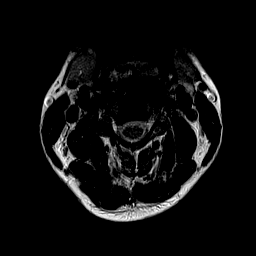
[im 24/28]
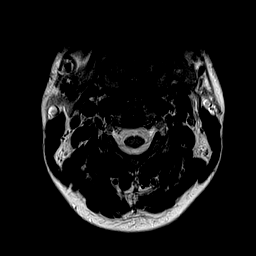
[im 28/28]
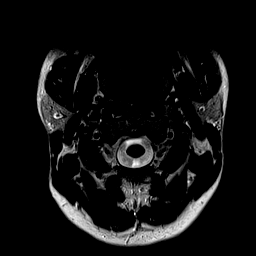

[Series 6: T1 · axial · 3.0mm · 0.35mm/px · z∈[-108,-5]mm · 8 of 28 slices shown (2 of 2)]
[im 1/28]
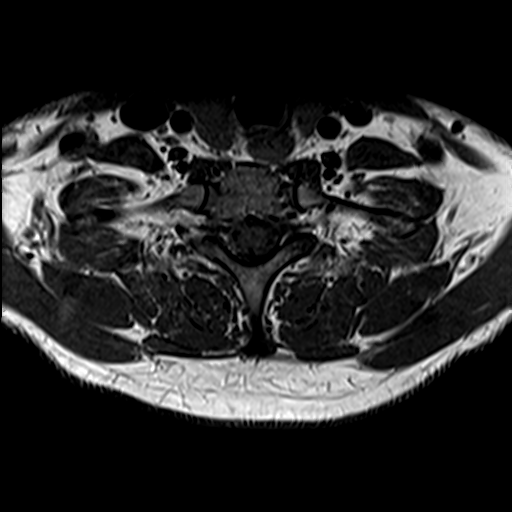
[im 4/28]
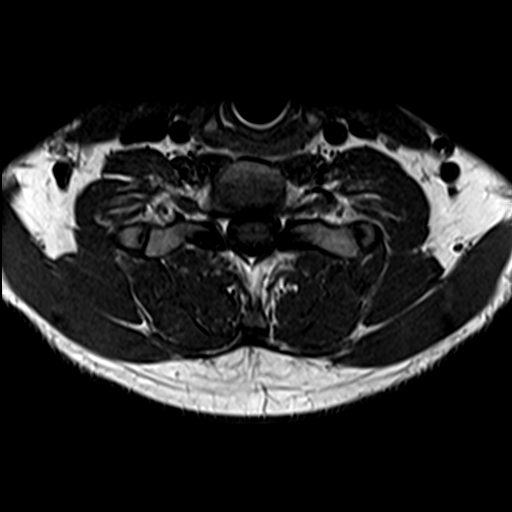
[im 8/28]
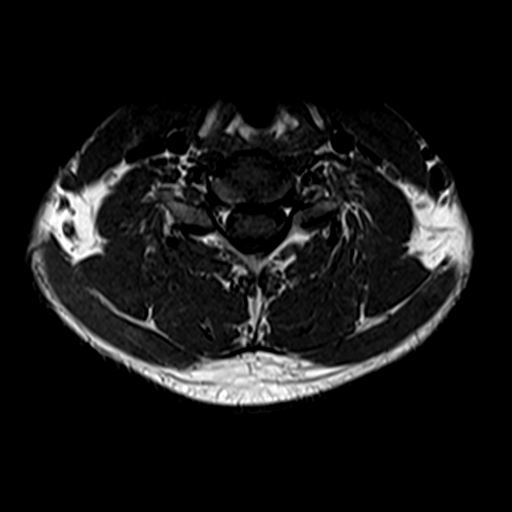
[im 12/28]
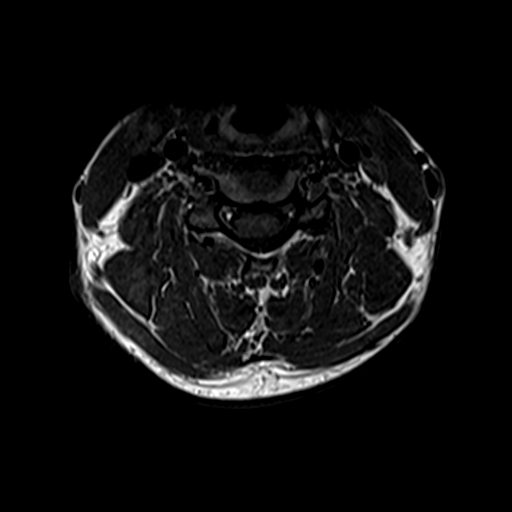
[im 16/28]
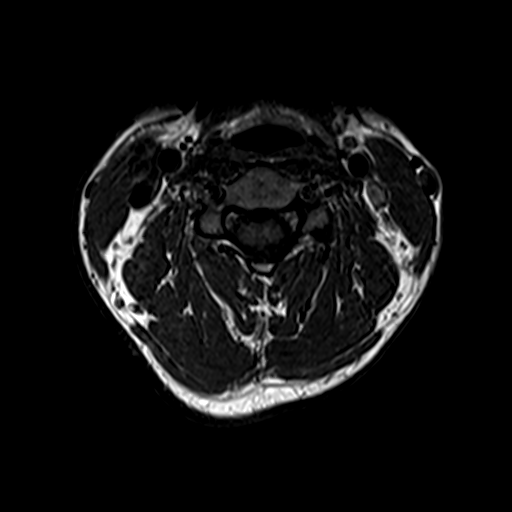
[im 20/28]
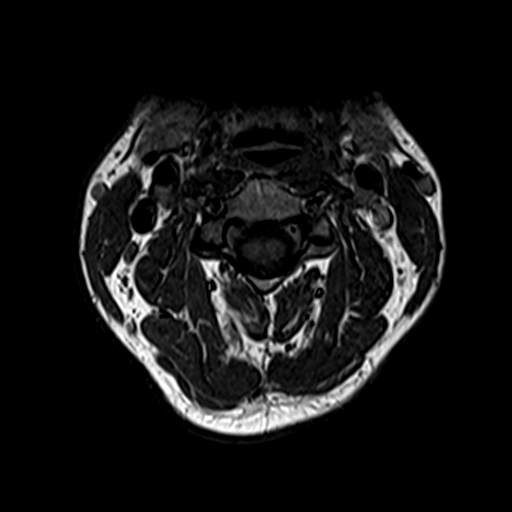
[im 24/28]
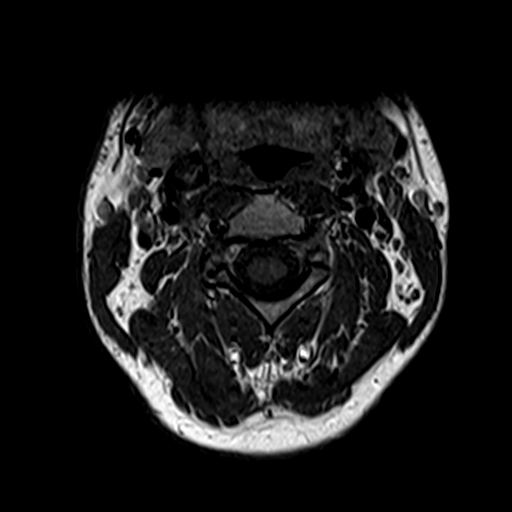
[im 28/28]
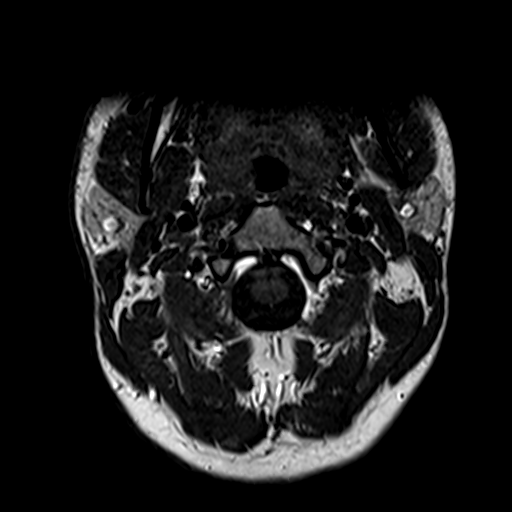

[Series 7: T2 · sagittal · 3.0mm · 0.41mm/px · 4 of 15 slices shown (2 of 2)]
[im 1/15]
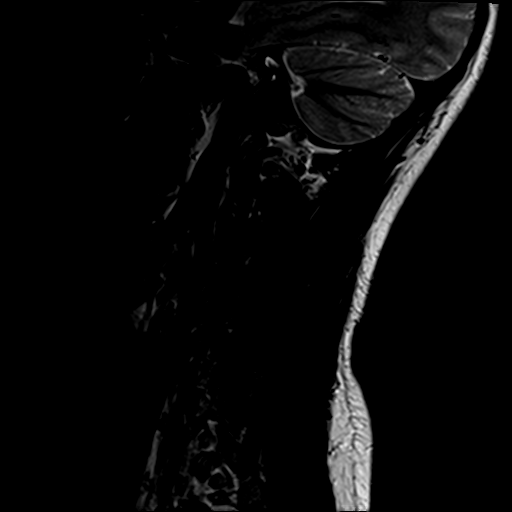
[im 5/15]
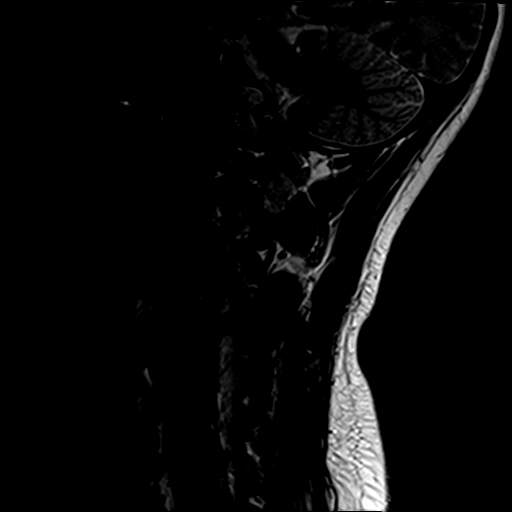
[im 10/15]
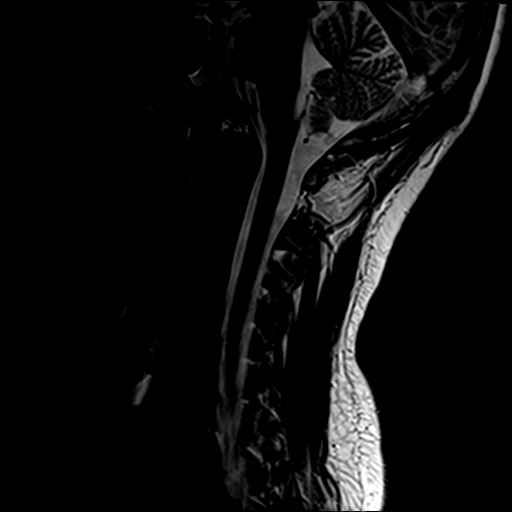
[im 15/15]
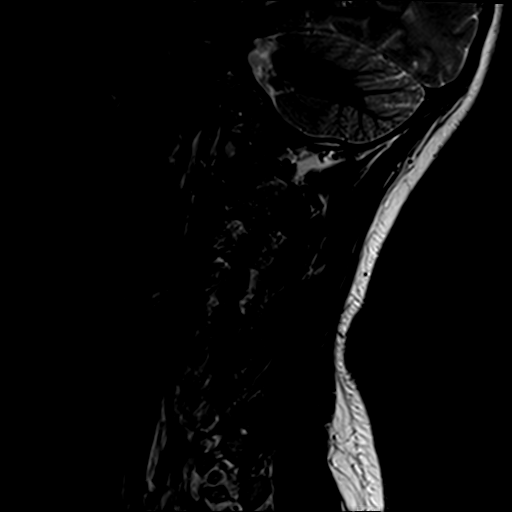

[Series 8: T1 fat-sat post-contrast · sagittal · 3.0mm · 0.82mm/px · 4 of 15 slices shown]
[im 1/15]
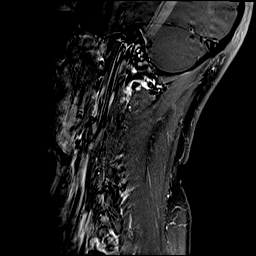
[im 5/15]
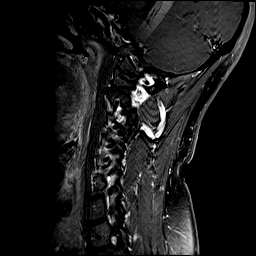
[im 10/15]
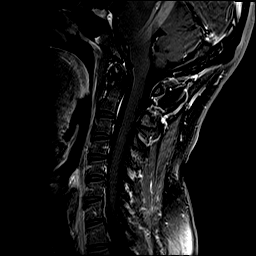
[im 15/15]
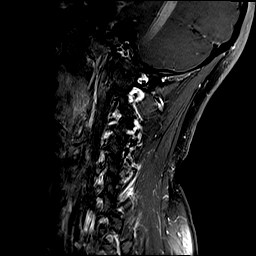

[Series 9: T1 post-contrast · axial · 3.0mm · 0.35mm/px · 1 of 28 slices shown]
[im 1/28]
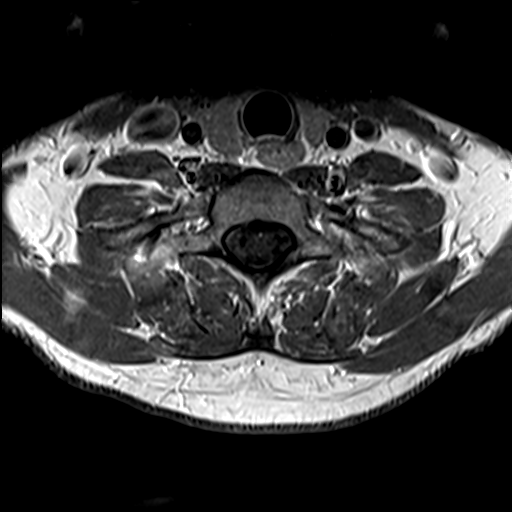

[33 of 48 positions shown; findings below may reference images not displayed]

FINDINGS: Alignment: Straightening of the cervical curvature.

Vertebrae: No fracture, evidence of discitis, or bone lesion.

Cord: Normal signal and morphology. No focus of abnormal contrast
enhancement.

Posterior Fossa, vertebral arteries, paraspinal tissues: Partial
empty sella incidentally noted, unusual for patient's age.

Disc levels:

No significant disc herniation, spinal canal or neural foraminal
stenosis at any level.
IMPRESSION: 1. Unremarkable MRI of the cervical spine.
2. No high-grade spinal canal or neural foraminal stenosis at any
level.
3. Partial empty sella incidentally noted.

## 2021-07-21 ENCOUNTER — Ambulatory Visit: Payer: BC Managed Care – PPO | Admitting: Urology

## 2021-07-21 ENCOUNTER — Other Ambulatory Visit: Payer: Self-pay

## 2021-07-21 ENCOUNTER — Encounter: Payer: Self-pay | Admitting: Urology

## 2021-07-21 VITALS — BP 121/83 | HR 105 | Ht 71.0 in | Wt 172.0 lb

## 2021-07-21 DIAGNOSIS — N4340 Spermatocele of epididymis, unspecified: Secondary | ICD-10-CM | POA: Diagnosis not present

## 2021-07-21 NOTE — Progress Notes (Signed)
07/21/2021  5:00 PM   Eric Vance 1986-06-19 [id number]  Referring provider: Dion Body, MD [facility] [place],  [place] [address]  Chief Complaint  Patient presents with   Other    HPI: 35 y.o. male presents for follow-up of a right hydrocelectomy.  Intermittently symptomatic right spermatocele which has been treated with aspiration Over the past several months he states the spermatocele has enlarged and is now similar in volume to the testis size Complains of nagging discomfort exacerbated with clothing Interested in pursuing spermatocelectomy He and his wife have a 41-monthold child though they are interested in having additional children  Saw Dr. MMatilde Sprangfor his lower urinary tract symptoms and etiology unclear based on urodynamic study.  Presently on Myrbetriq and is scheduled for a 636-monthollow-up   PMH: Past Medical History:  Diagnosis Date   Arthritis    lower back   Hypertension    [place]   Seasonal allergies     Surgical History: Past Surgical History:  Procedure Laterality Date   MOUTH SURGERY     NASAL TURBINATE REDUCTION Bilateral 08/28/2020   Procedure: TURBINATE REDUCTION/SUBMUCOSAL RESECTION;  Surgeon: McBeverly GustMD;  [place];  Laterality: Bilateral;   SEPTOPLASTY Bilateral 08/28/2020   Procedure: SEPTOPLASTY;  Surgeon: McBeverly GustMD;  [place];  Laterality: Bilateral;    Home Medications:  Allergies as of 07/21/2021       Reactions   Diclofenac Itching, Rash        Medication List        Accurate as of July 21, 2021  5:00 PM. If you have any questions, ask your nurse or doctor.          baclofen 10 MG tablet Commonly known as: LIORESAL Take 10 mg by mouth 2 (two) times daily.   busPIRone 10 MG tablet Commonly known as: BUSPAR Take 1 tablet by mouth 2 (two) times daily.   DULoxetine 60 MG  capsule Commonly known as: CYMBALTA Take 60 mg by mouth daily.   fexofenadine 180 MG tablet Commonly known as: ALLEGRA Take by mouth.   fluticasone 27.5 MCG/SPRAY nasal spray Commonly known as: VERAMYST Place into the nose.   hydrochlorothiazide 12.5 MG tablet Commonly known as: HYDRODIURIL Take 12.5 mg by mouth daily.   meloxicam 15 MG tablet Commonly known as: MOBIC   mirabegron ER 25 MG Tb24 tablet Commonly known as: MYRBETRIQ Take 1 tablet (25 mg total) by mouth daily.   montelukast 10 MG tablet Commonly known as: SINGULAIR Take 1 tablet by mouth daily.   MULTIVITAMIN MEN PO Take by mouth daily.   pregabalin 50 MG capsule Commonly known as: LYRICA Take 100 mg by mouth 2 (two) times daily.   tadalafil 20 MG tablet Commonly known as: CIALIS Take by mouth.   traZODone 50 MG tablet Commonly known as: DESYREL Take by mouth.        Allergies:  Allergies  Allergen Reactions   Diclofenac Itching and Rash    Family History: Family History  Problem Relation Age of Onset   Hypertension Father     Social History:  reports that he has never smoked. He has never used smokeless tobacco. He reports current alcohol use of about 8.0 standard drinks per week. He reports that he does not use drugs.   Physical Exam: BP 121/83   Pulse (!) 105   Ht '5\' 11"'$  (1.803 m)  Wt 172 lb (78 kg)   BMI 23.99 kg/m   Constitutional:  Alert and oriented, No acute distress. HEENT: [place] AT, moist mucus membranes.  Trachea midline, no masses. Cardiovascular: No clubbing, cyanosis, or edema. Respiratory: Normal respiratory effort, no increased work of breathing. GI: Abdomen is soft, nontender, nondistended, no abdominal masses GU: Phallus without lesion, testes descended bilateral without masses or tenderness.  ~ 3 x 4 cm right spermatocele.  Testes descended bilaterally without masses or tenderness Skin: No rashes, bruises or suspicious lesions. Neurologic: Grossly intact, no  focal deficits, moving all 4 extremities. Psychiatric: Normal mood and affect.   Assessment & Plan:    1.  Right spermatocele Symptomatic, large right spermatocele Spermatocelectomy was discussed including the potential risk of obstructive azoospermia of the affected testis.  Possibility of infertility was discussed though less likely with a normal contralateral testis We discussed the safest option would be to delay spermatocelectomy until his family was complete and continue aspiration as needed.  The option of sperm banking was also discussed He would like to think over these options and will call back with his decision   Abbie Sons, [facility] [address] [place], [place] [address] [phone]

## 2021-07-26 ENCOUNTER — Encounter: Payer: Self-pay | Admitting: Urology

## 2021-08-10 ENCOUNTER — Emergency Department
Admission: EM | Admit: 2021-08-10 | Discharge: 2021-08-10 | Disposition: A | Discharge status: Home / Self Care | Payer: BC Managed Care – PPO | Attending: Emergency Medicine | Admitting: Emergency Medicine

## 2021-08-10 ENCOUNTER — Other Ambulatory Visit: Payer: Self-pay

## 2021-08-10 DIAGNOSIS — R200 Anesthesia of skin: Secondary | ICD-10-CM | POA: Diagnosis not present

## 2021-08-10 DIAGNOSIS — I10 Essential (primary) hypertension: Secondary | ICD-10-CM | POA: Diagnosis not present

## 2021-08-10 DIAGNOSIS — Z79899 Other long term (current) drug therapy: Secondary | ICD-10-CM | POA: Diagnosis not present

## 2021-08-10 DIAGNOSIS — R42 Dizziness and giddiness: Secondary | ICD-10-CM | POA: Diagnosis not present

## 2021-08-10 DIAGNOSIS — R531 Weakness: Secondary | ICD-10-CM

## 2021-08-10 LAB — COMPREHENSIVE METABOLIC PANEL
ALT: 19 U/L (ref 0–44)
AST: 24 U/L (ref 15–41)
Albumin: 4.8 g/dL (ref 3.5–5.0)
Alkaline Phosphatase: 68 U/L (ref 38–126)
Anion gap: 14 (ref 5–15)
BUN: 17 mg/dL (ref 6–20)
CO2: 25 mmol/L (ref 22–32)
Calcium: 9.3 mg/dL (ref 8.9–10.3)
Chloride: 97 mmol/L — ABNORMAL LOW (ref 98–111)
Creatinine, Ser: 0.91 mg/dL (ref 0.61–1.24)
GFR, Estimated: >60 mL/min (ref >60)
Glucose, Bld: 117 mg/dL — ABNORMAL HIGH (ref 70–99)
Potassium: 3.7 mmol/L (ref 3.5–5.1)
Sodium: 136 mmol/L (ref 135–145)
Total Bilirubin: 0.9 mg/dL (ref 0.3–1.2)
Total Protein: 7.9 g/dL (ref 6.5–8.1)

## 2021-08-10 LAB — CBC
HCT: 41.5 % (ref 39.0–52.0)
Hemoglobin: 15 g/dL (ref 13.0–17.0)
MCH: 33.7 pg (ref 26.0–34.0)
MCHC: 36.1 g/dL — ABNORMAL HIGH (ref 30.0–36.0)
MCV: 93.3 fL (ref 80.0–100.0)
Platelets: 249 10*3/uL (ref 150–400)
RBC: 4.45 MIL/uL (ref 4.22–5.81)
RDW: 12 % (ref 11.5–15.5)
WBC: 7 10*3/uL (ref 4.0–10.5)
nRBC: 0 % (ref 0.0–0.2)

## 2021-08-10 LAB — MAGNESIUM: Magnesium: 2.2 mg/dL (ref 1.7–2.4)

## 2021-08-10 LAB — TSH: TSH: 3.523 u[IU]/mL (ref 0.350–4.500)

## 2021-08-10 MED ORDER — SODIUM CHLORIDE 0.9 % IV BOLUS
1000.0000 mL | Freq: Once | INTRAVENOUS | Status: AC
  Administered 2021-08-10: 1000 mL via INTRAVENOUS

## 2021-08-10 NOTE — ED Triage Notes (Signed)
Pt BIBA for syncopal episode. 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 Appt to see specialist in [place] next month.  Per family pt had syncopal episode lasting 2 hours today. Pt states "they usually only last 1-1.5h and I want to make sure something else isn't seriously wrong".     [id number]

## 2021-08-10 NOTE — ED Provider Notes (Signed)
[facility] Emergency Department Provider Note ____________________________________________   Event Date/Time   First MD Initiated Contact with Patient 08/10/21 2113     (approximate)  I have reviewed the triage vital signs and the nursing notes.  HISTORY  Chief Complaint Neurologic Problem  HPI Eric Vance is a 35 y.o. male history of hypertension, paresthesias, near syncopal episode on multiple occasions, and suspicion for possible nerve or autonomic dysfunction  Patient was at home he was sitting in a chair playing video games.  He started feeling lightheaded and weak.  He had to lay down and has had to lay down for about 2 hours before recovery.  He reports he will have episodes like this frequently and has had several over the last year they normally last about an hour to 45 minutes today was unusual for it to last 2 hours which prompted him to call EMS.  He has numbness and weakness in his left hand and also in both of his knees but these have been chronic now for about a year.  He seen neurology for these symptoms, has been referred now to the [facility] for further evaluation  No headache no new weakness or numbness.  No chest pain no trouble breathing.  No history of blood clots.  Reports he feels better now he is recovered his strength is back.  Unclear what is causing these episodes, but they seem to be becoming more frequent.   Past Medical History:  Diagnosis Date   Arthritis    lower back   Hypertension    Lupus ([place])    Seasonal allergies     Patient Active Problem List   Diagnosis Date Noted   Other insomnia 09/18/2019   Anxiety 08/01/2019   History of chronic pain 08/01/2019   History of gastritis 08/01/2019   Low vitamin D level 08/01/2019   Recurrent UTI 08/01/2019   Nocturia 08/01/2019   Pain in joints of right hand 01/31/2018   Traumatic rupture of collateral ligament of right index finger 11/27/2015   Vitamin D deficiency  08/12/2014   Right anterior knee pain 08/07/2014   Patellar tendinitis of right knee 06/13/2012   Patellar tendon rupture 04/12/2012   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 Authorizing Provider  baclofen (LIORESAL) 10 MG tablet Take 10 mg by mouth 2 (two) times daily. 09/30/20   [provider]  busPIRone (BUSPAR) 10 MG tablet Take 1 tablet by mouth 2 (two) times daily. 04/21/21   [provider]  DULoxetine (CYMBALTA) 60 MG capsule Take 60 mg by mouth daily. 09/30/20   [provider]  fexofenadine (ALLEGRA) 180 MG tablet Take by mouth.    [provider]  fluticasone (VERAMYST) 27.5 MCG/SPRAY nasal spray Place into the nose.    [provider]  hydrochlorothiazide (HYDRODIURIL) 12.5 MG tablet Take 12.5 mg by mouth daily. 06/11/20  [provider]  meloxicam (MOBIC) 15 MG tablet  05/02/19   [provider]  mirabegron ER (MYRBETRIQ) 25 MG TB24 tablet Take 1 tablet (25 mg total) by mouth daily. 06/09/21   MacDiarmid, Nicki Reaper, MD  montelukast (SINGULAIR) 10 MG tablet Take 1 tablet by mouth daily. 02/16/21   [provider]  Multiple Vitamins-Minerals (MULTIVITAMIN MEN PO) Take by mouth daily.    [provider]  pregabalin (LYRICA) 50 MG capsule Take 100 mg by mouth 2 (two) times daily. 01/13/21    [provider]  tadalafil (CIALIS) 20 MG tablet Take by mouth. 05/25/20   [provider]  traZODone (DESYREL) 50 MG tablet Take by mouth. 06/18/19 08/24/20  [provider]    Allergies Diclofenac  Family History  Problem Relation Age of Onset   Hypertension Father     Social History Social History   Tobacco Use   Smoking status: Never   Smokeless tobacco: Never  Vaping Use   Vaping Use: Never used  Substance Use Topics   Alcohol use: Yes    Alcohol/week: 8.0 standard drinks    Types: 8 Cans of beer per week    Comment: Socially    Drug use: No    Review of Systems Constitutional: No fever/chills Eyes: No visual changes. Cardiovascular: Denies chest pain. Respiratory: Denies shortness of breath. Gastrointestinal: No abdominal pain.   Genitourinary: Negative for dysuria. Musculoskeletal: Negative for back pain.  Some weakness in both of his knees been ongoing issue for 15 years.  Occasionally both of these will seem inflamed but not currently occurring.  Chronic joint aches in his knees Skin: Negative for rash. Neurological: Negative for headaches, areas of focal weakness or numbness except as noted in particular involving mild weakness of the left hand and both knees chronic now for some time.  When these episodes occur he will feel very almost paralyzed but he will recover usually within about 45 minutes to an hour, tonight it took 2.  Does take hydrochlorothiazide for hypertension  Denies a family history of rheumatoid or paralytic type symptoms  ____________________________________________   PHYSICAL EXAM:  VITAL SIGNS: ED Triage Vitals [08/10/21 2119]  Enc Vitals Group     BP (!) 129/96     Pulse Rate 77     Resp 18     Temp 98.6 F (37 C)     Temp Source Oral     SpO2 100 %     Weight 178 lb 9.2 oz (81 kg)     Height 5\' 11"  (1.803 m)     Head Circumference      Peak Flow      Pain Score      Pain Loc      Pain Edu?       Excl. in [place]?     Constitutional: Alert and oriented. Well appearing and in no acute distress.  He is very pleasant, amicable. Eyes: Conjunctivae are normal. Head: Atraumatic. Nose: No congestion/rhinnorhea. Mouth/Throat: Mucous membranes are moist. Neck: No stridor.  Cardiovascular: Normal rate, regular rhythm. Grossly normal heart sounds.  Good peripheral circulation. Respiratory: Normal respiratory effort.  No retractions. Lungs CTAB. Gastrointestinal: Soft and nontender. No distention. Musculoskeletal: No lower extremity tenderness nor edema. Neurologic:  Normal speech and language. No gross focal neurologic deficits are appreciated.  Equal smile.  5 out of 5 strength in all extremities.  No pronator drift in any extremity.  Equal smile.  He does not show evidence of neurologic  deficit.  Normal extraocular movements.  Normal level of alertness.  Of note the patient reports that his symptoms have recovered.  He does report a mild paresthesia involving the left hand and also some the left lower extremity, but reports this to be chronic in nature nothing new.  Normal patellar reflexes bilateral. Skin:  Skin is warm, dry and intact. No rash noted. Psychiatric: Mood and affect are normal. Speech and behavior are normal.  ____________________________________________   LABS (all labs ordered are listed, but only abnormal results are displayed)  Labs Reviewed  CBC - Abnormal; Notable for the following components:      Result Value   MCHC 36.1 (*)    All other components within normal limits  COMPREHENSIVE METABOLIC PANEL - Abnormal; Notable for the following components:   Chloride 97 (*)    Glucose, Bld 117 (*)    All other components within normal limits  TSH  MAGNESIUM   ____________________________________________  EKG  Reviewed interpreted by me at 2110 Heart rate 90 QRS 150 QTc 460 Incomplete right bundle branch block, otherwise normal EKG.  No noted evidence of WPW,  [place], or evidence of significant QT prolongation or ischemia. ____________________________________________  RADIOLOGY  I do not see evidence to suggest need for emergent imaging.  Patient reports a history of chronic and recurring symptoms, currently neurologically intact no symptoms that would localize to suggest an acute stroke tumor, or central etiology denoted at this time. ____________________________________________   PROCEDURES  Procedure(s) performed: None  Procedures  Critical Care performed: No  ____________________________________________   INITIAL IMPRESSION / ASSESSMENT AND PLAN / ED COURSE  Pertinent labs & imaging results that were available during my care of the patient were reviewed by me and considered in my medical decision making (see chart for details).   Patient presents having an episode of suddenly feeling weak, symptoms lasting for about 2 hours.  Has a history of same occurring multiple times in the past and reports typically last about 45 minutes to an hour.  He is alert well oriented hemodynamically stable.  There is mention by neurology of ongoing work-up, and a recommendation and for which she is working to acquire appointment at the [facility] in about a month.  At this time he is recovered well, he shows no evidence of neurologic deficit except for his chronic paresthesias.  He has had extensive work-up in the past and I doubt at this point that imaging of the brain or MRI would be helpful given past work-ups and history.  I do think that checking his electrolytes metabolic panel magnesium and TSH may be of utility.  He does take hydrochlorothiazide.  We will give IV fluids, continue to monitor patient overall reports he feels well recovered at this time.  His work-up here is unremarkable, he would be comfortable with plan to continue his follow-up and outpatient care.  I think this is very appropriate.  No associated symptoms of stroke, ACS, central  deficit, pulmonary symptoms etc.  Clinical Course as of 08/10/21 2235  Tue Aug 10, 2021  2116 Reviewed prior neurology consultation notes, also noted is a recent referral to the [facility].  Neurology did notes significant work-ups in the past for paresthesias, also recommendation to refer to autonomic dysfunction specialist. [MQ]    Clinical Course User Index [MQ] Delman Kitten, MD   Vitals:   08/10/21 2119  BP: (!) 129/96  Pulse: 77  Resp: 18  Temp: 98.6 F (37 C)  SpO2: 100%     -----------------------------------------  10:33 PM on 08/10/2021 ----------------------------------------- Patient resting comfortably, able to ambulate back and forth to the bathroom now without difficulty feels improved just slightly fatigued.  Labs reviewed, essentially unremarkable, just a very minimally diminished chloride which I suspect is not significant to today's event.  Discussed with the patient, he is really comfortable with plan for discharge, and follow-up with his primary care as well as continued plan to go to the [facility] for further work-up of these episodes.  His work-up here in the ER very reassuring, I think this is appropriate.  He does not appear to need inpatient hospitalization at this time.  Patient agreeable with this plan.  Also advised against driving while having symptoms such as this.  Return precautions and treatment recommendations and follow-up discussed with the patient who is agreeable with the plan.   ____________________________________________   FINAL CLINICAL IMPRESSION(S) / ED DIAGNOSES  Final diagnoses:  Episode of generalized weakness        Note:  This document was prepared using Dragon voice recognition software and may include unintentional dictation errors       Delman Kitten, MD 08/10/21 2243

## 2021-09-22 ENCOUNTER — Other Ambulatory Visit: Payer: Self-pay | Admitting: Internal Medicine

## 2021-09-22 DIAGNOSIS — G40909 Epilepsy, unspecified, not intractable, without status epilepticus: Secondary | ICD-10-CM

## 2021-09-22 DIAGNOSIS — G35 Multiple sclerosis: Secondary | ICD-10-CM

## 2021-12-06 DIAGNOSIS — S66919A Strain of unspecified muscle, fascia and tendon at wrist and hand level, unspecified hand, initial encounter: Secondary | ICD-10-CM | POA: Insufficient documentation

## 2022-01-26 ENCOUNTER — Encounter: Payer: Self-pay | Admitting: Urology

## 2022-01-26 ENCOUNTER — Other Ambulatory Visit: Payer: Self-pay

## 2022-01-26 ENCOUNTER — Ambulatory Visit: Payer: BC Managed Care – PPO | Admitting: Urology

## 2022-01-26 VITALS — BP 132/91 | HR 105 | Ht 71.0 in | Wt 172.0 lb

## 2022-01-26 DIAGNOSIS — N4340 Spermatocele of epididymis, unspecified: Secondary | ICD-10-CM | POA: Diagnosis not present

## 2022-01-27 NOTE — Progress Notes (Signed)
Procedure note ? ?Diagnosis: Right spermatocele ? ?Procedure: Spermatocele aspiration ? ?Indications: 36 y.o. 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  Has undergone periodic aspiration with resolution ? ?Anesthesia: 1% plain Xylocaine ? ?Description: The anterior scrotal skin was anesthetized with 1 mL of 1% plain Xylocaine.  A 20-gauge angiocatheter was inserted through the anesthetized area.  The needle was removed and no fluid was obtained on aspiration.  The right hemiscrotum was tented superiorly.  A second angiocatheter was placed and 40 mL of clear fluid was aspirated. ? ?Complications: None ? ?Plan:  ?Call for fever, scrotal erythema ?Follow-up prn ? ? ? ?John Giovanni, MD ? ?

## 2022-02-04 ENCOUNTER — Other Ambulatory Visit: Payer: BC Managed Care – PPO | Admitting: Urology

## 2022-02-15 DIAGNOSIS — M659 Unspecified synovitis and tenosynovitis, unspecified site: Secondary | ICD-10-CM | POA: Insufficient documentation

## 2022-02-15 DIAGNOSIS — R52 Pain, unspecified: Secondary | ICD-10-CM | POA: Insufficient documentation

## 2022-02-15 DIAGNOSIS — M6281 Muscle weakness (generalized): Secondary | ICD-10-CM | POA: Insufficient documentation

## 2022-03-31 DIAGNOSIS — R569 Unspecified convulsions: Secondary | ICD-10-CM | POA: Insufficient documentation

## 2022-05-03 DIAGNOSIS — M45A0 Non-radiographic axial spondyloarthritis of unspecified sites in spine: Secondary | ICD-10-CM | POA: Insufficient documentation

## 2022-06-06 ENCOUNTER — Ambulatory Visit: Payer: BC Managed Care – PPO | Admitting: Urology

## 2022-06-06 ENCOUNTER — Encounter: Payer: Self-pay | Admitting: Urology

## 2022-06-06 VITALS — BP 142/90 | HR 105 | Ht 70.0 in | Wt 172.0 lb

## 2022-06-06 DIAGNOSIS — N3946 Mixed incontinence: Secondary | ICD-10-CM | POA: Diagnosis not present

## 2022-06-06 MED ORDER — MIRABEGRON ER 25 MG PO TB24: 25.0000 mg | ORAL_TABLET | Freq: Every day | ORAL | 3 refills | Status: DC

## 2022-06-06 NOTE — Progress Notes (Signed)
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  He said his flow symptoms are worsening.  Last Friday he was 10 or 12 hours before he could void.  This is the first time this happened.  His leaking is from overactivity that he does not feel.  He understands the options noted.  He is not can learn how to self catheterize at this stage.  He understands the beta 3 agonist could worsen his symptoms.  Reassess on Myrbetriq 25 mg samples and prescription and reassess in 6 weeks with a residual.  He does have more neurologic testing coming   It must be frustrating having a symptoms with some unknown diagnosis.  He understands conceptually about InterStim but certainly we would not do this at this stage of the condition.  He understands the urodynamics support the diagnosis of neurogenic bladder but are not definitive   Today's residual 0 mL Patient said for 3 weeks he did not leak without awareness or with bending and it was completely dry.  Now a 70% better.  It did not slow down his flow.  3 days he did have some trouble to urinate on different days but overall flow has been consistent and unchanged.  Clinically not infected     He will try the new beta 3 agonist for 1 month with samples co-pay card and prescription.  I will see him back in 7 weeks.  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  He has to go to [place] to the [facility] because I think he may have an autoimmune neurologic issue.  Testing can be done there.  He failed the  other beta 3 agonist.  In the last few weeks he is under a lot of stress and is leaking more and having less signal to go.  He is having a little more trouble empty  Today Had a spermatocele aspirated by my partner March 2023 Patient could not get into the referral center because of their local triage.  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  He can void on command much better and its intermittent in terms of initiating a flow  He may have a rheumatoid arthritic issue because now his joints are starting not to work well.  They are going to try some injections.  He thinks he has progressed some overall and his 67-year-old may now have nerve function issues below the waist that he may have inherited   PMH: Past Medical History:  Diagnosis Date   Arthritis    lower back   Hypertension    Lupus ([place])    Seasonal allergies     Surgical History: Past Surgical History:  Procedure Laterality Date   MOUTH SURGERY     NASAL TURBINATE REDUCTION Bilateral 08/28/2020   Procedure: TURBINATE REDUCTION/SUBMUCOSAL RESECTION;  Surgeon: Beverly Gust, MD;  Location: [place];  Service: ENT;  Laterality: Bilateral;   SEPTOPLASTY Bilateral 08/28/2020   Procedure: SEPTOPLASTY;  Surgeon:  Beverly Gust, MD;  Location: [place];  Service: ENT;  Laterality: Bilateral;    Home Medications:  Allergies as of 06/06/2022       Reactions   Diclofenac Itching, Rash, Hives   Other reaction(s): Other (See Comments)        Medication List        Accurate as of June 06, 2022  2:58 PM. If you have any questions, ask your nurse or doctor.          STOP taking these medications    traZODone 50 MG tablet Commonly known as: DESYREL Stopped by: Reece Packer, MD       TAKE these medications    DULoxetine 60 MG capsule Commonly known as: CYMBALTA Take 60 mg by mouth daily.   fexofenadine 180 MG tablet Commonly known as: ALLEGRA Take by mouth.    fluticasone 27.5 MCG/SPRAY nasal spray Commonly known as: VERAMYST Place into the nose.   hydrochlorothiazide 12.5 MG tablet Commonly known as: HYDRODIURIL Take 12.5 mg by mouth daily.   mirabegron ER 25 MG Tb24 tablet Commonly known as: MYRBETRIQ Take 1 tablet (25 mg total) by mouth daily.   montelukast 10 MG tablet Commonly known as: SINGULAIR Take 1 tablet by mouth daily.   MULTIVITAMIN MEN PO Take by mouth daily.   tadalafil 20 MG tablet Commonly known as: CIALIS Take by mouth.        Allergies:  Allergies  Allergen Reactions   Diclofenac Itching, Rash and Hives    Other reaction(s): Other (See Comments)    Family History: Family History  Problem Relation Age of Onset   Hypertension Father     Social History:  reports that he has never smoked. He has never been exposed to tobacco smoke. He has never used smokeless tobacco. He reports current alcohol use of about 8.0 standard drinks of alcohol per week. He reports that he does not use drugs.  ROS:                                        Physical Exam: There were no vitals taken for this visit.  Constitutional:  Alert and oriented, No acute distress. HEENT: [place] AT, moist mucus membranes.  Trachea midline, no masses.   Laboratory Data: Lab Results  Component Value Date   WBC 7.0 08/10/2021   HGB 15.0 08/10/2021   HCT 41.5 08/10/2021   MCV 93.3 08/10/2021   PLT 249 08/10/2021    Lab Results  Component Value Date   CREATININE 0.91 08/10/2021    No results found for: "PSA"  Lab Results  Component Value Date   TESTOSTERONE 322 04/24/2020    No results found for: "HGBA1C"  Urinalysis    Component Value Date/Time   APPEARANCEUR Clear 05/11/2020 1439   GLUCOSEU Negative 05/11/2020 1439   BILIRUBINUR Negative 05/11/2020 1439   PROTEINUR Negative 05/11/2020 1439   NITRITE Negative 05/11/2020 1439   LEUKOCYTESUR Negative 05/11/2020 1439    Pertinent  Imaging:   Assessment & Plan: 90x3 Myrbetriq sent to pharmacy.  Hopefully he can continue to get the cheaper medication on the assistance program.  Otherwise we will need to give him a lot of samples.  I will reassess in a year  There are no diagnoses linked to this encounter.  No follow-ups on file.  Reece Packer, MD  [facility] [address],  [place], [place] [address] [phone]

## 2022-09-06 ENCOUNTER — Ambulatory Visit: Payer: BC Managed Care – PPO | Admitting: Dermatology

## 2022-09-06 DIAGNOSIS — D2272 Melanocytic nevi of left lower limb, including hip: Secondary | ICD-10-CM

## 2022-09-06 DIAGNOSIS — D229 Melanocytic nevi, unspecified: Secondary | ICD-10-CM

## 2022-09-06 DIAGNOSIS — L700 Acne vulgaris: Secondary | ICD-10-CM

## 2022-09-06 MED ORDER — DOXYCYCLINE HYCLATE 20 MG PO TABS: 20.0000 mg | ORAL_TABLET | Freq: Two times a day (BID) | ORAL | 2 refills | Status: AC

## 2022-09-06 NOTE — Patient Instructions (Addendum)
Reviewed potential side effects of isotretinoin including xerosis, cheilitis, hepatitis, hyperlipidemia, and severe birth defects if taken by a pregnant woman. Reviewed reports of suicidal ideation in those with a history of depression while taking isotretinoin and reports of diagnosis of inflammatory bowl disease while taking isotretinoin as well as the lack of evidence for a causal relationship between isotretinoin, depression and IBD. Patient advised to reach out with any questions or concerns. Patient advised not to share pills or donate blood while on treatment or for one month after completing treatment.  Start doxycycline 20 mg twice daily with food.   Doxycycline should be taken with food to prevent nausea. Do not lay down for 30 minutes after taking. Be cautious with sun exposure and use good sun protection while on this medication. Pregnant women should not take this medication.   Recommend taking Heliocare sun protection supplement daily in sunny weather for additional sun protection. For maximum protection on the sunniest days, you can take up to 2 capsules of regular Heliocare OR take 1 capsule of Heliocare Ultra. For prolonged exposure (such as a full day in the sun), you can repeat your dose of the supplement 4 hours after your first dose. Heliocare can be purchased at [employer], at some [facility] or at [email or web address].    Melanoma ABCDEs  Melanoma is the most dangerous type of skin cancer, and is the leading cause of death from skin disease.  You are more likely to develop melanoma if you: Have light-colored skin, light-colored eyes, or red or blond hair Spend a lot of time in the sun Tan regularly, either outdoors or in a tanning bed Have had blistering sunburns, especially during childhood Have a close family member who has had a melanoma Have atypical moles or large birthmarks  Early detection of melanoma is key since treatment is typically straightforward and cure  rates are extremely high if we catch it early.   The first sign of melanoma is often a change in a mole or a new dark spot.  The ABCDE system is a way of remembering the signs of melanoma.  A for asymmetry:  The two halves do not match. B for border:  The edges of the growth are irregular. C for color:  A mixture of colors are present instead of an even brown color. D for diameter:  Melanomas are usually (but not always) greater than 24m - the size of a pencil eraser. E for evolution:  The spot keeps changing in size, shape, and color.  Please check your skin once per month between visits. You can use a small mirror in front and a large mirror behind you to keep an eye on the back side or your body.   If you see any new or changing lesions before your next follow-up, please call to schedule a visit.  Please continue daily skin protection including broad spectrum sunscreen SPF 30+ to sun-exposed areas, reapplying every 2 hours as needed when you're outdoors.    Due to recent changes in healthcare laws, you may see results of your pathology and/or laboratory studies on MyChart before the doctors have had a chance to review them. We understand that in some cases there may be results that are confusing or concerning to you. Please understand that not all results are received at the same time and often the doctors may need to interpret multiple results in order to provide you with the best plan of care or course of treatment. Therefore, we  ask that you please give [place] 2 business days to thoroughly review all your results before contacting the office for clarification. Should we see a critical lab result, you will be contacted sooner.   If You Need Anything After Your Visit  If you have any questions or concerns for your doctor, please call our main line at [phone] and press option 4 to reach your doctor's medical assistant. If no one answers, please leave a voicemail as directed and we will  return your call as soon as possible. Messages left after 4 pm will be answered the following business day.   You may also send [place] a message via [place]. We typically respond to MyChart messages within 1-2 business days.  For prescription refills, please ask your pharmacy to contact our office. Our fax number is [phone].  If you have an urgent issue when the clinic is closed that cannot wait until the next business day, you can page your doctor at the number below.    Please note that while we do our best to be available for urgent issues outside of office hours, we are not available 24/7.   If you have an urgent issue and are unable to reach [place], you may choose to seek medical care at your doctor's office, retail clinic, urgent care center, or emergency room.  If you have a medical emergency, please immediately call 911 or go to the emergency department.  Pager Numbers  - Dr. Nehemiah Massed: [phone]  - Dr. Laurence Ferrari: [phone]  - Dr. Nicole Kindred: [phone]  In the event of inclement weather, please call our main line at [phone] for an update on the status of any delays or closures.  Dermatology Medication Tips: Please keep the boxes that topical medications come in in order to help keep track of the instructions about where and how to use these. Pharmacies typically print the medication instructions only on the boxes and not directly on the medication tubes.   If your medication is too expensive, please contact our office at [phone] option 4 or send [place] a message through [place].   We are unable to tell what your co-pay for medications will be in advance as this is different depending on your insurance coverage. However, we may be able to find a substitute medication at lower cost or fill out paperwork to get insurance to cover a needed medication.   If a prior authorization is required to get your medication covered by your insurance company, please allow [place] 1-2 business  days to complete this process.  Drug prices often vary depending on where the prescription is filled and some pharmacies may offer cheaper prices.  The website www.goodrx.com contains coupons for medications through different pharmacies. The prices here do not account for what the cost may be with help from insurance (it may be cheaper with your insurance), but the website can give you the price if you did not use any insurance.  - You can print the associated coupon and take it with your prescription to the pharmacy.  - You may also stop by our office during regular business hours and pick up a GoodRx coupon card.  - If you need your prescription sent electronically to a different pharmacy, notify our office through [facility] or by phone at [phone] option 4.     Si Usted Necesita Algo Despus de Su Visita  Tambin puede enviarnos un mensaje a travs de Pharmacist, community. Por lo general respondemos a los Systems analyst en  el transcurso de 1 a 2 das hbiles.  Para renovar recetas, por favor pida a su farmacia que se ponga en contacto con nuestra oficina. Harland Dingwall de fax es [place] [phone].  Si tiene un asunto urgente cuando la clnica est cerrada y que no puede esperar hasta el siguiente da hbil, puede llamar/localizar a su doctor(a) al nmero que aparece a continuacin.   Por favor, tenga en cuenta que aunque hacemos todo lo posible para estar disponibles para asuntos urgentes fuera del horario de [place], no estamos disponibles las 24 horas del da, los 7 das de la [place].   Si tiene un problema urgente y no puede comunicarse con nosotros, puede optar por buscar atencin mdica  en el consultorio de su doctor(a), en una clnica privada, en un centro de atencin urgente o en una sala de emergencias.  Si tiene Engineering geologist, por favor llame inmediatamente al 911 o vaya a la sala de emergencias.  Nmeros de bper  - Dr. Nehemiah Massed: [phone]  - Dra. Moye:  [phone]  - Dra. Nicole Kindred: [phone]  En caso de inclemencias del [place], por favor llame a Johnsie Kindred principal al [phone] para una actualizacin sobre el [place] de cualquier retraso o cierre.  Consejos para la medicacin en dermatologa: Por favor, guarde las cajas en las que vienen los medicamentos de uso tpico para ayudarle a seguir las instrucciones sobre dnde y cmo usarlos. Las farmacias generalmente imprimen las instrucciones del medicamento slo en las cajas y no directamente en los tubos del [place].   Si su medicamento es muy caro, por favor, pngase en contacto con Zigmund Daniel llamando al [phone] y presione la opcin 4 o envenos un mensaje a travs de Pharmacist, community.   No podemos decirle cul ser su copago por los medicamentos por adelantado ya que esto es diferente dependiendo de la cobertura de su seguro. Sin embargo, es posible que podamos encontrar un medicamento sustituto a Electrical engineer un formulario para que el seguro cubra el medicamento que se considera necesario.   Si se requiere una autorizacin previa para que su compaa de seguros [place] su medicamento, por favor permtanos de 1 a 2 das hbiles para completar este proceso.  Los precios de los medicamentos varan con frecuencia dependiendo del Environmental consultant de dnde se surte la receta y alguna farmacias pueden ofrecer precios ms baratos.  El sitio web www.goodrx.com tiene cupones para medicamentos de Airline pilot. Los precios aqu no tienen en cuenta lo que podra costar con la ayuda del seguro (puede ser ms barato con su seguro), pero el sitio web puede darle el precio si no utiliz Research scientist (physical sciences).  - Puede imprimir el cupn correspondiente y llevarlo con su receta a la farmacia.  - Tambin puede pasar por nuestra oficina durante el horario de atencin regular y Charity fundraiser una tarjeta de cupones de GoodRx.  - Si necesita que su receta se enve electrnicamente a una farmacia diferente,  informe a nuestra oficina a travs de MyChart de [place] o por telfono llamando al [phone] y presione la opcin 4.

## 2022-09-06 NOTE — Progress Notes (Unsigned)
   New Patient Visit  Subjective  Eric Vance is a 36 y.o. male who presents for the following: Nevus (Patient with a new spot at left lower leg that is sore. Patient noticed it about 1 month ago. Fhx MM. ).  Patient previously saw Dr. Nehemiah Massed.   The following portions of the chart were reviewed this encounter and updated as appropriate:   Tobacco  Allergies  Meds  Problems  Med Hx  Surg Hx  Fam Hx      Review of Systems:  No other skin or systemic complaints except as noted in HPI or Assessment and Plan.  Objective  Well appearing patient in no apparent distress; mood and affect are within normal limits.  A focused examination was performed including right lower leg, face. Relevant physical exam findings are noted in the Assessment and Plan.  left pretibia 0.45 cm light brown thin papule, symmetric without features suspicious for malignancy on dermoscopy   face Chest with rare inflammatory papule Back with rare small inflammatory papule Face and neck with scattered inflammatory papules, few cysts favoring beard distribution, trace open comedones Scattered perifollicular papules and pustules at thighs and lower legs    Assessment & Plan  Nevus left pretibia  Benign-appearing.  Observation.  Call clinic for new or changing lesions.    Acne vulgaris face  Chronic and persistent condition with duration over one year. Condition is bothersome/symptomatic for patient. Currently flared.  Patient has done a course of accutane in the past > 10 years. Patient advises he was never totally clear.  Reviewed potential side effects of isotretinoin including xerosis, cheilitis, hepatitis, hyperlipidemia, and severe birth defects if taken by a pregnant woman. Reviewed reports of suicidal ideation in those with a history of depression while taking isotretinoin and reports of diagnosis of inflammatory bowl disease while taking isotretinoin as well as the lack of evidence for a  causal relationship between isotretinoin, depression and IBD. Patient advised to reach out with any questions or concerns. Patient advised not to share pills or donate blood while on treatment or for one month after completing treatment.  Hx depression, nothing recent Patient with RA, ankylosing spondylosis Patient has started Cimzia and has significant dry mouth  Consider treating with isotretinoin (though concern for worsened dry mouth) or red light PDT Will hold off on accutane at this time due to dry mouth Start doxycycline 20 mg twice daily with food.   Doxycycline should be taken with food to prevent nausea. Do not lay down for 30 minutes after taking. Be cautious with sun exposure and use good sun protection while on this medication. Pregnant women should not take this medication.    doxycycline (PERIOSTAT) 20 MG tablet - face Take 1 tablet (20 mg total) by mouth 2 (two) times daily. Take with food   Return in about 2 months (around 11/06/2022) for acne.  Graciella Belton, RMA, am acting as scribe for Forest Gleason, MD .  Documentation: I have reviewed the above documentation for accuracy and completeness, and I agree with the above.  Forest Gleason, MD

## 2022-09-07 ENCOUNTER — Encounter: Payer: Self-pay | Admitting: Dermatology

## 2022-09-13 ENCOUNTER — Other Ambulatory Visit: Payer: Self-pay

## 2022-09-13 ENCOUNTER — Ambulatory Visit: Payer: BC Managed Care – PPO | Attending: Obstetrics

## 2022-09-13 ENCOUNTER — Other Ambulatory Visit: Payer: Self-pay | Admitting: Obstetrics

## 2022-09-13 NOTE — Progress Notes (Unsigned)
Carrier screening drawn today for

## 2022-09-22 ENCOUNTER — Telehealth: Payer: Self-pay | Admitting: Obstetrics and Gynecology

## 2022-09-22 NOTE — Telephone Encounter (Addendum)
Results of the [facility] Carrier screening were given to Eric Vance's wife, Eric Vance, per his request.  This screening test includes carrier testing for 115 genetic conditions. Results were negative for all of the genes, as described below. It is important to remember that carrier screening can greatly reduce, but cannot eliminate, the chance for someone to be a carrier or have an affected child.  Residual risks for all conditions are included in the lab report.  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 This condition is recessive and is only expected to result in an affected child when both parents are carriers. The residual risk for any future pregnancy to have that condition is estimated to be 1 in 22,800.   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 Fanconi anemia is characterized by a decrease in bone marrow function, an increased cancer risk, and physical abnormalities. Fanconi anemia is also inherited in an autosomal recessive pattern. Because Eric Vance already screened negative for changes in this gene, the couple currently has a 1 in 166,400 chance of having a child with Fanconi anemia. Of note, there are numerous genes associated with Fanconi anemia, some of which may result in an increased cancer risk in individuals who are carriers.  Persons who are heterozygous for a change in the [facility] gene do not appear to be at increased risk for cancer.  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 This is not expected to be associated with disease though may result in false positive results on some biochemical tests, including newborn screening in some states.   The couple was encouraged to reach out to [place] at [phone] with any questions or concerns.  Eric Finlay, MS,  CGC

## 2022-11-02 ENCOUNTER — Ambulatory Visit: Payer: BC Managed Care – PPO | Admitting: Dermatology

## 2022-11-02 DIAGNOSIS — L700 Acne vulgaris: Secondary | ICD-10-CM

## 2022-11-02 DIAGNOSIS — S0100XA Unspecified open wound of scalp, initial encounter: Secondary | ICD-10-CM

## 2022-11-02 DIAGNOSIS — L739 Follicular disorder, unspecified: Secondary | ICD-10-CM | POA: Diagnosis not present

## 2022-11-02 DIAGNOSIS — L731 Pseudofolliculitis barbae: Secondary | ICD-10-CM

## 2022-11-02 DIAGNOSIS — T148XXA Other injury of unspecified body region, initial encounter: Secondary | ICD-10-CM

## 2022-11-02 MED ORDER — MUPIROCIN 2 % EX OINT: TOPICAL_OINTMENT | CUTANEOUS | 0 refills | Status: DC

## 2022-11-02 MED ORDER — TRETINOIN 0.025 % EX CREA: TOPICAL_CREAM | CUTANEOUS | 11 refills | Status: DC

## 2022-11-02 MED ORDER — CLINDAMYCIN PHOSPHATE 1 % EX SOLN: CUTANEOUS | 11 refills | Status: DC

## 2022-11-02 NOTE — Patient Instructions (Addendum)
For Folliculitis  Start tretinion 0.025 % cream - apply pea sized amount to face qhs for acne Can use with moisturizer before or after applying Can use every other day if too drying  Start clindamycin 1 % external solution - apply topically to aa of face   Topical retinoid medications like tretinoin/Retin-A, adapalene/Differin, tazarotene/Fabior, and Epiduo/Epiduo Forte can cause dryness and irritation when first started. Only apply a pea-sized amount to the entire affected area. Avoid applying it around the eyes, edges of mouth and creases at the nose. If you experience irritation, use a good moisturizer first and/or apply the medicine less often. If you are doing well with the medicine, you can increase how often you use it until you are applying every night. Be careful with sun protection while using this medication as it can make you sensitive to the sun. This medicine should not be used by pregnant women.   Recommend [facility] Hypochlorous Spray (found in the wound care section) OR Cln brand Acne or Sports wash. The [facility] Hypochlorous Spray can be sprayed on daily and left on. The Cln wash should be applied to the affected area daily for at least 30 seconds and then rinsed off. If you are using clindamycin solution or lotion or another topical antibiotic to treat acne, using a hypochlorous product may help lower the risk of antibiotic resistant bacteria.    Stop tretinoin one week before red light Stop therapy Stop doxycycline (since not working). Do not use in the 5 days before red light therapy.     Photodynamic Therapy/Red Light Therapy   Photodynamic Therapy (PDT), also known as "Red light therapy" is an in office procedure used to treat actinic keratoses. It works by targeting acne. After treatment, these cells peel off and are replaced by healthy ones.   For your phototherapy appointment, you will have two appointments on the day of your treatment. The first appointment will be  to apply a cream to the treatment area. You will leave this cream on for 1-2 hours depending on the area being treated. The second appointment will be to shine a blue light on the area for 16 minutes to kill off the precancer cells. It is common to experience a burning sensation during the treatment.  After your treatment, it will be important to keep the treated areas of skin out of the sun completely for 48-72 hours (2-3 days) to prevent having a reaction.   Common side effects include: - Burning or stinging, which may be severe and can last up to 24-72 hours after your treatment - Scaling and crusting which may last up to 2 weeks - Redness, swelling and/or peeling which can last up to 4 weeks  To Care for Your Skin After PDT/Red Light Therapy: - Wash with soap, water and shampoo as normal. - If needed, you can use cold compresses (e.g. ice packs) for comfort - If okay with your primary care doctor, you may use analgesics such as acetaminophen (tylenol) every 4-6 hours, not to exceed recommended dose - You may apply Cerave Healing Ointment, Vaseline or Aquaphor as needed - If you have a lot of swelling you may take a Benadryl to help with this (this may cause drowsiness), not to exceed recommended dose. This may increase the risk of falls in people over 65 and may slow reaction time while driving, so it is not recommended to take before driving or operating machinery. - Sun Precautions - Wear a wide brim hat for the  next week if outside  - Wear a sunblock with zinc or titanium dioxide at least SPF 50 daily  If you have any questions or concerns, please call the office and ask to speak with a nurse.   -------------------------------------------------------------------------------------------------------------- Discussed cosmetic procedure, noncovered.  $60 for 1st lesion and $15 for each additional lesion if done on the same day.  Maximum charge $350.  One touch-up treatment included no charge.  Discussed risks of treatment including dyspigmentation, small scar, and/or recurrence. Recommend daily broad spectrum sunscreen SPF 30+/photoprotection to treated areas once healed.

## 2022-11-02 NOTE — Progress Notes (Signed)
Follow-Up Visit   Subjective  Eric Vance is a 36 y.o. male who presents for the following: Acne (2 month follow up on acne, currently ) and OTHER (Spot at back of scalp would like checked reports noticed 6 weeks ago.).  The following portions of the chart were reviewed this encounter and updated as appropriate:  Tobacco  Allergies  Meds  Problems  Med Hx  Surg Hx  Fam Hx      Review of Systems: No other skin or systemic complaints except as noted in HPI or Assessment and Plan.   Objective  Well appearing patient in no apparent distress; mood and affect are within normal limits.  A focused examination was performed including face, neck, chest, back. Relevant physical exam findings are noted in the Assessment and Plan.  face Many inflammatory papules and pustules  right occipital scalp erosion  face, chest, back, neck Trace open comedones with many inflammatory papules and pustules, favor lower half of face and neck. Chest is clear.    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 Condition is bothersome/symptomatic for patient. Currently flared.  Start tretinion 0.025 % cream - apply pea sized amount to face qhs Can use with moisturizer before or after applying Can use every other day if too drying  Start clindamycin 1 % external solution - apply to aa face twice daily   Recommend [facility] Hypochlorous Spray (found in the wound care section) OR Cln brand Acne or Sports wash. The [facility] Hypochlorous Spray can be sprayed on daily and left on. The Cln wash should be applied to the affected area daily for at least 30 seconds and then rinsed off. If you are using clindamycin solution or lotion or another topical antibiotic to treat acne, using a hypochlorous product may help lower the risk of antibiotic resistant bacteria.   Topical retinoid medications like tretinoin/Retin-A,  adapalene/Differin, tazarotene/Fabior, and Epiduo/Epiduo Forte can cause dryness and irritation when first started. Only apply a pea-sized amount to the entire affected area. Avoid applying it around the eyes, edges of mouth and creases at the nose. If you experience irritation, use a good moisturizer first and/or apply the medicine less often. If you are doing well with the medicine, you can increase how often you use it until you are applying every night. Be careful with sun protection while using this medication as it can make you sensitive to the sun. This medicine should not be used by pregnant women.   Stop tretinoin 1 week before red light PDT   tretinoin (RETIN-A) 0.025 % cream - face Apply pea sized amount to face nightly for acne as tolerated.  clindamycin (CLEOCIN T) 1 % external solution - face Apply to aa of face twice daily for folliculitis  Open wound right occipital scalp  Start mupirocin ointment 2 % - apply to aa of wound at scalp tid until healed.   Recheck in 4-6 weeks if not healed Avoid scratching or rubbing  mupirocin ointment (BACTROBAN) 2 % - right occipital scalp Apply topically to wound at scalp tid until healed  Acne vulgaris face, chest, back, neck  Chronic and persistent condition with duration over one year. Condition is bothersome/symptomatic for patient. Currently flared.   Previously took isotretinoin > 10 years ago but was unable to tolerate due to significant dryness at very low doses. Anti-SS-A and anti-SS-B reviewed and wnl.   Has failed isotretinoin, oral antibiotics, and  topicals  Plan trial of red light photodynamic therapy with rx ameluz to face. We have samples and will plan to do this as a training case.   tretinoin (RETIN-A) 0.025 % cream - face, chest, back, neck Apply pea sized amount to face nightly for acne as tolerated.  Folliculitis  Related Medications clindamycin (CLEOCIN T) 1 % external solution Apply to aa of face twice  daily for folliculitis   Return for schedule Red light therapy for acne in January  , 3 month acne follow up. I, Ruthell Rummage, CMA, am acting as scribe for Forest Gleason, MD.  Documentation: I have reviewed the above documentation for accuracy and completeness, and I agree with the above.  Forest Gleason, MD

## 2022-11-09 ENCOUNTER — Encounter: Payer: Self-pay | Admitting: Dermatology

## 2022-11-23 ENCOUNTER — Ambulatory Visit: Payer: BC Managed Care – PPO

## 2022-11-28 ENCOUNTER — Ambulatory Visit (INDEPENDENT_AMBULATORY_CARE_PROVIDER_SITE_OTHER): Payer: BC Managed Care – PPO

## 2022-11-28 DIAGNOSIS — L700 Acne vulgaris: Secondary | ICD-10-CM | POA: Diagnosis not present

## 2022-11-28 MED ORDER — AMINOLEVULINIC ACID HCL 10 % EX GEL
2000.0000 mg | Freq: Once | CUTANEOUS | Status: AC
  Administered 2022-11-28: 2000 mg via TOPICAL

## 2022-11-28 NOTE — Progress Notes (Addendum)
Patient completed red light therapy for acne vulgaris today for training purposes. Samples provided by company.      1. Acne vulgaris Head - Anterior (Face)  Photodynamic therapy - Head - Anterior (Face) Procedure discussed: discussed risks, benefits, side effects. and alternatives   Prep: site scrubbed/prepped with acetone   Location:  Face and neck Number of lesions:  Multiple Type of treatment:  Red light Aminolevulinic Acid (see MAR for details): Ameluz Amount of Ameluz (mg):  1 Incubation time (minutes):  90 Number of minutes under lamp:  20 Number of seconds under lamp:  0 Cooling:  Floor fan and fan Outcome: patient tolerated procedure well with no complications   Post-procedure details: sunscreen applied    Related Medications Aminolevulinic Acid HCl 10 % GEL 2,000 mg    Johnsie Kindred, RMA

## 2022-11-28 NOTE — Patient Instructions (Signed)

## 2022-12-15 ENCOUNTER — Ambulatory Visit (INDEPENDENT_AMBULATORY_CARE_PROVIDER_SITE_OTHER): Payer: BC Managed Care – PPO

## 2022-12-15 DIAGNOSIS — L700 Acne vulgaris: Secondary | ICD-10-CM

## 2022-12-15 MED ORDER — AMELUZ 10 % EX GEL: 1.0000 | Freq: Once | CUTANEOUS | 0 refills | Status: AC

## 2022-12-15 NOTE — Progress Notes (Addendum)
Patient completed red light therapy for acne vulgaris today for training purposes. Samples provided by company.   Patient completed PDT therapy today.  1. Acne vulgaris Head - Anterior (Face)  Photodynamic therapy - Head - Anterior (Face) Procedure discussed: discussed risks, benefits, side effects. and alternatives   Prep: site scrubbed/prepped with acetone   Location:  Face Number of lesions:  Multiple Type of treatment:  Red light Aminolevulinic Acid (see MAR for details): Ameluz Aminolevulinic Acid comment:  LOT: [id number] EXP: 01/12/2023 Amount of Ameluz (mg):  1 Incubation time (minutes):  90 Number of minutes under lamp:  20 Number of seconds under lamp:  0 Cooling:  Floor fan and fan Outcome: patient tolerated procedure well with no complications   Post-procedure details: sunscreen applied    Consider chemical peel in future.   Johnsie Kindred, RMA

## 2022-12-27 DIAGNOSIS — Z7960 Long term (current) use of unspecified immunomodulators and immunosuppressants: Secondary | ICD-10-CM | POA: Insufficient documentation

## 2023-01-27 ENCOUNTER — Encounter: Payer: Self-pay | Admitting: Dermatology

## 2023-02-02 ENCOUNTER — Ambulatory Visit: Payer: BC Managed Care – PPO | Admitting: Dermatology

## 2023-02-02 ENCOUNTER — Encounter: Payer: Self-pay | Admitting: Dermatology

## 2023-02-02 DIAGNOSIS — L700 Acne vulgaris: Secondary | ICD-10-CM | POA: Diagnosis not present

## 2023-02-02 DIAGNOSIS — L280 Lichen simplex chronicus: Secondary | ICD-10-CM

## 2023-02-02 DIAGNOSIS — L0100 Impetigo, unspecified: Secondary | ICD-10-CM | POA: Diagnosis not present

## 2023-02-02 DIAGNOSIS — L731 Pseudofolliculitis barbae: Secondary | ICD-10-CM | POA: Diagnosis not present

## 2023-02-02 NOTE — Patient Instructions (Addendum)
Continue tretinoin 0.025% cream at bedtime Continue clindamycin solution in the morning  Start opzelura cream twice a day to affected areas as needed. This should not be used long-term on more than 10% of your skin, and short term (up to 3 months) on up to 20% of your skin.  Due to recent changes in healthcare laws, you may see results of your pathology and/or laboratory studies on MyChart before the doctors have had a chance to review them. We understand that in some cases there may be results that are confusing or concerning to you. Please understand that not all results are received at the same time and often the doctors may need to interpret multiple results in order to provide you with the best plan of care or course of treatment. Therefore, we ask that you please give [place] 2 business days to thoroughly review all your results before contacting the office for clarification. Should we see a critical lab result, you will be contacted sooner.   If You Need Anything After Your Visit  If you have any questions or concerns for your doctor, please call our main line at [phone] and press option 4 to reach your doctor's medical assistant. If no one answers, please leave a voicemail as directed and we will return your call as soon as possible. Messages left after 4 pm will be answered the following business day.   You may also send [place] a message via [place]. We typically respond to MyChart messages within 1-2 business days.  For prescription refills, please ask your pharmacy to contact our office. Our fax number is [phone].  If you have an urgent issue when the clinic is closed that cannot wait until the next business day, you can page your doctor at the number below.    Please note that while we do our best to be available for urgent issues outside of office hours, we are not available 24/7.   If you have an urgent issue and are unable to reach [place], you may choose to seek medical care at your  doctor's office, retail clinic, urgent care center, or emergency room.  If you have a medical emergency, please immediately call 911 or go to the emergency department.  Pager Numbers  - Dr. Nehemiah Massed: [phone]  - Dr. Laurence Ferrari: [phone]  - Dr. Nicole Kindred: [phone]  In the event of inclement weather, please call our main line at [phone] for an update on the status of any delays or closures.  Dermatology Medication Tips: Please keep the boxes that topical medications come in in order to help keep track of the instructions about where and how to use these. Pharmacies typically print the medication instructions only on the boxes and not directly on the medication tubes.   If your medication is too expensive, please contact our office at [phone] option 4 or send [place] a message through [place].   We are unable to tell what your co-pay for medications will be in advance as this is different depending on your insurance coverage. However, we may be able to find a substitute medication at lower cost or fill out paperwork to get insurance to cover a needed medication.   If a prior authorization is required to get your medication covered by your insurance company, please allow [place] 1-2 business days to complete this process.  Drug prices often vary depending on where the prescription is filled and some pharmacies may offer cheaper prices.  The website www.goodrx.com contains coupons for medications through  different pharmacies. The prices here do not account for what the cost may be with help from insurance (it may be cheaper with your insurance), but the website can give you the price if you did not use any insurance.  - You can print the associated coupon and take it with your prescription to the pharmacy.  - You may also stop by our office during regular business hours and pick up a GoodRx coupon card.  - If you need your prescription sent electronically to a different pharmacy, notify  our office through [facility] or by phone at [phone] option 4.     Si Usted Necesita Algo Despus de Su Visita  Tambin puede enviarnos un mensaje a travs de Pharmacist, community. Por lo general respondemos a los mensajes de MyChart en el transcurso de 1 a 2 das hbiles.  Para renovar recetas, por favor pida a su farmacia que se ponga en contacto con nuestra oficina. Harland Dingwall de fax es [place] [phone].  Si tiene un asunto urgente cuando la clnica est cerrada y que no puede esperar hasta el siguiente da hbil, puede llamar/localizar a su doctor(a) al nmero que aparece a continuacin.   Por favor, tenga en cuenta que aunque hacemos todo lo posible para estar disponibles para asuntos urgentes fuera del horario de [place], no estamos disponibles las 24 horas del da, los 7 das de la [place].   Si tiene un problema urgente y no puede comunicarse con nosotros, puede optar por buscar atencin mdica  en el consultorio de su doctor(a), en una clnica privada, en un centro de atencin urgente o en una sala de emergencias.  Si tiene Engineering geologist, por favor llame inmediatamente al 911 o vaya a la sala de emergencias.  Nmeros de bper  - Dr. Nehemiah Massed: [phone]  - Dra. Moye: [phone]  - Dra. Nicole Kindred: [phone]  En caso de inclemencias del [place], por favor llame a Johnsie Kindred principal al [phone] para una actualizacin sobre el [place] de cualquier retraso o cierre.  Consejos para la medicacin en dermatologa: Por favor, guarde las cajas en las que vienen los medicamentos de uso tpico para ayudarle a seguir las instrucciones sobre dnde y cmo usarlos. Las farmacias generalmente imprimen las instrucciones del medicamento slo en las cajas y no directamente en los tubos del [place].   Si su medicamento es muy caro, por favor, pngase en contacto con Zigmund Daniel llamando al [phone] y presione la opcin 4 o envenos un mensaje a travs de  Pharmacist, community.   No podemos decirle cul ser su copago por los medicamentos por adelantado ya que esto es diferente dependiendo de la cobertura de su seguro. Sin embargo, es posible que podamos encontrar un medicamento sustituto a Electrical engineer un formulario para que el seguro cubra el medicamento que se considera necesario.   Si se requiere una autorizacin previa para que su compaa de seguros [place] su medicamento, por favor permtanos de 1 a 2 das hbiles para completar este proceso.  Los precios de los medicamentos varan con frecuencia dependiendo del Environmental consultant de dnde se surte la receta y alguna farmacias pueden ofrecer precios ms baratos.  El sitio web www.goodrx.com tiene cupones para medicamentos de Airline pilot. Los precios aqu no tienen en cuenta lo que podra costar con la ayuda del seguro (puede ser ms barato con su seguro), pero el sitio web puede darle el precio si no utiliz Research scientist (physical sciences).  - Puede imprimir el cupn correspondiente y llevarlo con  con su receta a la farmacia.  - Tambin puede pasar por nuestra oficina durante el horario de atencin regular y recoger una tarjeta de cupones de GoodRx.  - Si necesita que su receta se enve electrnicamente a una farmacia diferente, informe a nuestra oficina a travs de MyChart de [place] o por telfono llamando al [phone] y presione la opcin 4.  

## 2023-02-02 NOTE — Progress Notes (Signed)
   Follow-Up Visit   Subjective  Eric Vance is a 37 y.o. male who presents for the following: has a spot at chin that came up 6 days ago and has been putting mupirocin on it twice daily, has improved.  Patient has been seen for pseudofolliculitis barbae and is using tretinoin 0.025% and clindamycin solution once daily. Patient has also done a few red light therapy treatments.   The following portions of the chart were reviewed this encounter and updated as appropriate: medications, allergies, medical history  Review of Systems:  No other skin or systemic complaints except as noted in HPI or Assessment and Plan.  Objective  Well appearing patient in no apparent distress; mood and affect are within normal limits.   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  Will start opzelura cream twice a day to affected areas as needed. This should not be used long-term on more than 10% of your skin, and short term (up to 3 months) on up to 20% of your skin. 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 Condition is bothersome/symptomatic for patient. Currently flared.  Treatment Plan: Doxycycline did not seem to help much so defer. Continue tretinoin 0.025% cream QHS Continue clindamycin solution QAM Will schedule for TheraClear training   Favor Resolving Bullous Impetigo Exam:    Treatment Plan: Continue mupirocin twice daily until resolved    Return in about 30 days (around 03/04/2023) for acne.  Graciella Belton, RMA, am acting as scribe for  Forest Gleason, MD .   Documentation: I have reviewed the above documentation for accuracy and completeness, and I agree with the above.  Forest Gleason, MD

## 2023-02-08 ENCOUNTER — Ambulatory Visit: Payer: BC Managed Care – PPO | Admitting: Dermatology

## 2023-03-08 ENCOUNTER — Ambulatory Visit: Payer: BC Managed Care – PPO | Admitting: Dermatology

## 2023-03-08 ENCOUNTER — Encounter: Payer: Self-pay | Admitting: Dermatology

## 2023-03-09 ENCOUNTER — Ambulatory Visit: Payer: BC Managed Care – PPO | Admitting: Dermatology

## 2023-03-29 ENCOUNTER — Ambulatory Visit: Payer: BC Managed Care – PPO | Admitting: Dermatology

## 2023-03-29 ENCOUNTER — Encounter: Payer: Self-pay | Admitting: Dermatology

## 2023-03-29 DIAGNOSIS — L731 Pseudofolliculitis barbae: Secondary | ICD-10-CM

## 2023-03-29 DIAGNOSIS — L700 Acne vulgaris: Secondary | ICD-10-CM

## 2023-03-29 DIAGNOSIS — L089 Local infection of the skin and subcutaneous tissue, unspecified: Secondary | ICD-10-CM | POA: Diagnosis not present

## 2023-03-29 DIAGNOSIS — L280 Lichen simplex chronicus: Secondary | ICD-10-CM | POA: Diagnosis not present

## 2023-03-29 MED ORDER — SILVER SULFADIAZINE 1 % EX CREA: 1.0000 | TOPICAL_CREAM | Freq: Every day | CUTANEOUS | 0 refills | Status: DC

## 2023-03-29 MED ORDER — TACROLIMUS 0.1 % EX OINT: TOPICAL_OINTMENT | Freq: Two times a day (BID) | CUTANEOUS | 1 refills | Status: DC

## 2023-03-29 NOTE — Patient Instructions (Addendum)
Recommend N-acetylcysteine (NAC) 600 mg supplement three times per day to help with picking  Continue clindamycin solution in the morning Discontinue tretinoin  Start tacrolimus twice daily to inflamed areas as needed Continue Opzelura once daily     Due to recent changes in healthcare laws, you may see results of your pathology and/or laboratory studies on MyChart before the doctors have had a chance to review them. We understand that in some cases there may be results that are confusing or concerning to you. Please understand that not all results are received at the same time and often the doctors may need to interpret multiple results in order to provide you with the best plan of care or course of treatment. Therefore, we ask that you please give [place] 2 business days to thoroughly review all your results before contacting the office for clarification. Should we see a critical lab result, you will be contacted sooner.   If You Need Anything After Your Visit  If you have any questions or concerns for your doctor, please call our main line at [phone] and press option 4 to reach your doctor's medical assistant. If no one answers, please leave a voicemail as directed and we will return your call as soon as possible. Messages left after 4 pm will be answered the following business day.   You may also send [place] a message via MyChart. We typically respond to MyChart messages within 1-2 business days.  For prescription refills, please ask your pharmacy to contact our office. Our fax number is [phone].  If you have an urgent issue when the clinic is closed that cannot wait until the next business day, you can page your doctor at the number below.    Please note that while we do our best to be available for urgent issues outside of office hours, we are not available 24/7.   If you have an urgent issue and are unable to reach [place], you may choose to seek medical care at your doctor's office,  retail clinic, urgent care center, or emergency room.  If you have a medical emergency, please immediately call 911 or go to the emergency department.  Pager Numbers  - Dr. Gwen Pounds: [phone]  - Dr. Neale Burly: [phone]  - Dr. Roseanne Reno: [phone]  In the event of inclement weather, please call our main line at [phone] for an update on the status of any delays or closures.  Dermatology Medication Tips: Please keep the boxes that topical medications come in in order to help keep track of the instructions about where and how to use these. Pharmacies typically print the medication instructions only on the boxes and not directly on the medication tubes.   If your medication is too expensive, please contact our office at [phone] option 4 or send [place] a message through MyChart.   We are unable to tell what your co-pay for medications will be in advance as this is different depending on your insurance coverage. However, we may be able to find a substitute medication at lower cost or fill out paperwork to get insurance to cover a needed medication.   If a prior authorization is required to get your medication covered by your insurance company, please allow [place] 1-2 business days to complete this process.  Drug prices often vary depending on where the prescription is filled and some pharmacies may offer cheaper prices.  The website www.goodrx.com contains coupons for medications through different pharmacies. The prices here do not account for what the  cost may be with help from insurance (it may be cheaper with your insurance), but the website can give you the price if you did not use any insurance.  - You can print the associated coupon and take it with your prescription to the pharmacy.  - You may also stop by our office during regular business hours and pick up a GoodRx coupon card.  - If you need your prescription sent electronically to a different pharmacy, notify our office through  [facility] or by phone at [phone] option 4.

## 2023-03-29 NOTE — Progress Notes (Signed)
   Follow-Up Visit   Subjective  Eric Vance is a 37 y.o. male who presents for the following: acne follow up. Patient currently using tretinoin 0.025% cream and clindamycin solution and used a sample of Opzelura. Patient not seeing any improvement.    The following portions of the chart were reviewed this encounter and updated as appropriate: medications, allergies, medical history  Review of Systems:  No other skin or systemic complaints except as noted in HPI or Assessment and Plan.  Objective  Well appearing patient in no apparent distress; mood and affect are within normal limits.   A focused examination was performed of the following areas: Face, neck  Relevant exam findings are noted in the Assessment and Plan.  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 Condition is bothersome/symptomatic for patient. Currently flared.  He previously failed isotretinoin, doxycycline  Treatment Plan: Continue tretinoin 0.025% cream QHS Continue clindamycin solution QAM Will schedule for TheraClear training  Start tacrolimus twice daily to inflamed areas as needed Continue Opzelura once daily  Recommend patient d/c tretinoin 1 week prior to [employer] treatment.           Local infection of skin and subcutaneous tissue face  Has already treated with mupirocin  Start silver sulfadiazine cream daily to affected area   Related Procedures Anaerobic and Aerobic Culture    Return for Theraclear, June 4th with Dr. Neale Burly.  Anise Salvo, RMA, am acting as scribe for Darden Dates, MD .   Documentation: I have reviewed the above documentation for accuracy and completeness, and I agree with the above.  Darden Dates, MD

## 2023-04-04 ENCOUNTER — Ambulatory Visit: Payer: BC Managed Care – PPO

## 2023-04-05 ENCOUNTER — Ambulatory Visit (INDEPENDENT_AMBULATORY_CARE_PROVIDER_SITE_OTHER): Payer: BC Managed Care – PPO

## 2023-04-05 NOTE — Progress Notes (Addendum)
Patient here today for a TheraClear TRAINING session for acne vulgaris.   Treatment #1 Treatment Area: Neck and Face Energy: 3 Vacuum: 1 Pulse: Double Pulse Delay: 500  Photos taken and placed in media.  Patient understands and agrees he has not taken any forms of Isotretinoin within the last 4-6 months, he has not taken any Doxycycline or used topical Retin-A. Patient unable to tolerate suggested settings for skin type two.  Dorathy Daft, RMA

## 2023-04-06 ENCOUNTER — Telehealth: Payer: Self-pay

## 2023-04-06 LAB — ANAEROBIC AND AEROBIC CULTURE

## 2023-04-06 NOTE — Telephone Encounter (Signed)
Patient advised of culture results. aw 

## 2023-04-06 NOTE — Telephone Encounter (Addendum)
Called patient regarding results. No answer. LMOM for patient to call office    ----- Message from Sandi Mealy, MD sent at 04/06/2023  1:06 PM EDT ----- Culture shows normal acne-causing bacteria. No additional treatment needed, but if he keeps getting spots, can consider using hypochlorous spray once to twice a day to face and neck or using the Cln acne wash in the shower.   MAs please call. Thank you!

## 2023-04-17 ENCOUNTER — Ambulatory Visit (INDEPENDENT_AMBULATORY_CARE_PROVIDER_SITE_OTHER): Payer: BC Managed Care – PPO

## 2023-04-17 DIAGNOSIS — L700 Acne vulgaris: Secondary | ICD-10-CM

## 2023-04-17 NOTE — Progress Notes (Addendum)
Patient here today for a TheraClear session for acne vulgaris.    Treatment #2 Treatment Area: Neck and Face Energy: 4 Vacuum: 2 Pulse: Double Pulse Delay: 750   Photos taken and placed in media.  Patient understands and agrees he has not taken any forms of Isotretinoin within the last 4-6 months, he has not taken any Doxycycline or used topical Retin-A.     Dorathy Daft, RMA

## 2023-04-18 ENCOUNTER — Ambulatory Visit: Payer: BC Managed Care – PPO | Admitting: Dermatology

## 2023-04-18 ENCOUNTER — Encounter: Payer: Self-pay | Admitting: Dermatology

## 2023-04-18 VITALS — BP 134/99 | HR 81

## 2023-04-18 DIAGNOSIS — L731 Pseudofolliculitis barbae: Secondary | ICD-10-CM

## 2023-04-18 DIAGNOSIS — L280 Lichen simplex chronicus: Secondary | ICD-10-CM

## 2023-04-18 DIAGNOSIS — L719 Rosacea, unspecified: Secondary | ICD-10-CM | POA: Diagnosis not present

## 2023-04-18 DIAGNOSIS — L700 Acne vulgaris: Secondary | ICD-10-CM

## 2023-04-18 MED ORDER — IVERMECTIN 1 % EX CREA: TOPICAL_CREAM | CUTANEOUS | 3 refills | Status: DC

## 2023-04-18 MED ORDER — KETOCONAZOLE 2 % EX CREA
1.0000 | TOPICAL_CREAM | Freq: Two times a day (BID) | CUTANEOUS | 3 refills | Status: DC
Start: 2023-04-18 — End: 2023-04-18

## 2023-04-18 MED ORDER — MINOCYCLINE HCL 100 MG PO CAPS
100.0000 mg | ORAL_CAPSULE | Freq: Two times a day (BID) | ORAL | 2 refills | Status: DC
Start: 2023-04-18 — End: 2023-07-10

## 2023-04-18 MED ORDER — IVERMECTIN 1 % EX CREA
TOPICAL_CREAM | CUTANEOUS | 3 refills | Status: DC
Start: 2023-04-18 — End: 2023-04-18

## 2023-04-18 MED ORDER — MINOCYCLINE HCL 100 MG PO CAPS
100.0000 mg | ORAL_CAPSULE | Freq: Two times a day (BID) | ORAL | 2 refills | Status: DC
Start: 2023-04-18 — End: 2023-04-18

## 2023-04-18 MED ORDER — KETOCONAZOLE 2 % EX CREA
1.0000 | TOPICAL_CREAM | Freq: Two times a day (BID) | CUTANEOUS | 3 refills | Status: AC
Start: 2023-04-18 — End: 2023-05-30

## 2023-04-18 NOTE — Progress Notes (Signed)
   Follow-Up Visit   Subjective  Eric Vance is a 37 y.o. male who presents for the following: follow up on acne. Has seen some improvement while getting TheraClear treatment but still flared.   The following portions of the chart were reviewed this encounter and updated as appropriate: medications, allergies, medical history  Review of Systems:  No other skin or systemic complaints except as noted in HPI or Assessment and Plan.  Objective  Well appearing patient in no apparent distress; mood and affect are within normal limits.    A focused examination was performed of the following areas: Face, neck  Relevant exam findings are noted in the Assessment and Plan.          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 Condition is symptomatic/ bothersome to patient. Not currently at goal. Has improved some   He previously failed isotretinoin (could not tolerate due to dry eye), doxycycline (inadequate response), a variety of topicals including tretinoin, clindamycin, PDT, blue light therapy   Treatment Plan:  Recommend 1-2 more treatments of Theraclear to see if it can get him more clear  For Face and Neck  Apply soolantra daily Apply ketoconazole cream 1-2 times per day Apply Stratacel twice a day Apply opzelura and tacrolimus ointment twice a day anywhere that is red or inflamed  Continue N-acetylcysteine 600 mg supplement by mouth three times daily   After finishing Theraclear treatments Start minocycline 100 mg capsule - take 1 capsule by mouth twice daily with food.   Minocycline should be taken with food to prevent nausea. Do not lay down for 30 minutes after taking. Be cautious with sun exposure and use good sun protection while on this medication. Pregnant  women should not take this medication.   Restart Tretinion 0.025 % cream nightly to face and neck  If doing well after this, consider trying low dose doxycycline for maintenance  If not, may consider low dose isotretinoin to see if he can tolerate it.    Return for August with Dr. Katrinka Blazing acne follow up .  I, Asher Muir, CMA, am acting as scribe for Darden Dates, MD.   Documentation: I have reviewed the above documentation for accuracy and completeness, and I agree with the above.  Darden Dates, MD

## 2023-04-18 NOTE — Patient Instructions (Addendum)
For Face and Neck  Apply soolantra daily Apply ketoconazole cream 1-2 times per day Apply Stratacel twice a day Apply opzelura and tacrolimus ointment twice a day anywhere that is red or inflamed  Continue N-acetylcysteine 600 mg supplement by mouth three times daily   After finishing Theraclear treatments Start minocycline 100 mg capsule - take 1 capsule by mouth twice daily with food.   Minocycline should be taken with food to prevent nausea. Do not lay down for 30 minutes after taking. Be cautious with sun exposure and use good sun protection while on this medication. Pregnant women should not take this medication.   Restart Tretinion 0.025 % cream nightly to face and neck     Due to recent changes in healthcare laws, you may see results of your pathology and/or laboratory studies on MyChart before the doctors have had a chance to review them. We understand that in some cases there may be results that are confusing or concerning to you. Please understand that not all results are received at the same time and often the doctors may need to interpret multiple results in order to provide you with the best plan of care or course of treatment. Therefore, we ask that you please give [place] 2 business days to thoroughly review all your results before contacting the office for clarification. Should we see a critical lab result, you will be contacted sooner.   If You Need Anything After Your Visit  If you have any questions or concerns for your doctor, please call our main line at [phone] and press option 4 to reach your doctor's medical assistant. If no one answers, please leave a voicemail as directed and we will return your call as soon as possible. Messages left after 4 pm will be answered the following business day.   You may also send [place] a message via MyChart. We typically respond to MyChart messages within 1-2 business days.  For prescription refills, please ask your pharmacy to contact  our office. Our fax number is [phone].  If you have an urgent issue when the clinic is closed that cannot wait until the next business day, you can page your doctor at the number below.    Please note that while we do our best to be available for urgent issues outside of office hours, we are not available 24/7.   If you have an urgent issue and are unable to reach [place], you may choose to seek medical care at your doctor's office, retail clinic, urgent care center, or emergency room.  If you have a medical emergency, please immediately call 911 or go to the emergency department.  Pager Numbers  - Dr. Gwen Pounds: [phone]  - Dr. Neale Burly: [phone]  - Dr. Roseanne Reno: [phone]  In the event of inclement weather, please call our main line at [phone] for an update on the status of any delays or closures.  Dermatology Medication Tips: Please keep the boxes that topical medications come in in order to help keep track of the instructions about where and how to use these. Pharmacies typically print the medication instructions only on the boxes and not directly on the medication tubes.   If your medication is too expensive, please contact our office at [phone] option 4 or send [place] a message through MyChart.   We are unable to tell what your co-pay for medications will be in advance as this is different depending on your insurance coverage. However, we may be able to find a substitute  medication at lower cost or fill out paperwork to get insurance to cover a needed medication.   If a prior authorization is required to get your medication covered by your insurance company, please allow [place] 1-2 business days to complete this process.  Drug prices often vary depending on where the prescription is filled and some pharmacies may offer cheaper prices.  The website www.goodrx.com contains coupons for medications through different pharmacies. The prices here do not account for what the cost  may be with help from insurance (it may be cheaper with your insurance), but the website can give you the price if you did not use any insurance.  - You can print the associated coupon and take it with your prescription to the pharmacy.  - You may also stop by our office during regular business hours and pick up a GoodRx coupon card.  - If you need your prescription sent electronically to a different pharmacy, notify our office through [facility] or by phone at [phone] option 4.     Si Usted Necesita Algo Despus de Su Visita  Tambin puede enviarnos un mensaje a travs de Clinical cytogeneticist. Por lo general respondemos a los mensajes de MyChart en el transcurso de 1 a 2 das hbiles.  Para renovar recetas, por favor pida a su farmacia que se ponga en contacto con nuestra oficina. Annie Sable de fax es [place] [phone].  Si tiene un asunto urgente cuando la clnica est cerrada y que no puede esperar hasta el siguiente da hbil, puede llamar/localizar a su doctor(a) al nmero que aparece a continuacin.   Por favor, tenga en cuenta que aunque hacemos todo lo posible para estar disponibles para asuntos urgentes fuera del horario de [place], no estamos disponibles las 24 horas del da, los 7 [address]  [address] de la [place].   Si tiene un problema urgente y no puede comunicarse con nosotros, puede optar por buscar atencin mdica  en el consultorio de su doctor(a), en una clnica privada, en un centro de atencin urgente o en una sala de emergencias.  Si tiene Engineer, drilling, por favor llame inmediatamente al 911 o vaya a la sala de emergencias.  Nmeros de bper  - Dr. Gwen Pounds: [phone]  - Dra. Delorice Bannister: [phone]  - Dra. Roseanne Reno: [phone]  En caso de inclemencias del [place], por favor llame a Lacy Duverney principal al [phone] para una actualizacin sobre el [place] de cualquier retraso o cierre.  Consejos para la medicacin en dermatologa: Por favor, guarde las cajas en  las que vienen los medicamentos de uso tpico para ayudarle a seguir las instrucciones sobre dnde y cmo usarlos. Las farmacias generalmente imprimen las instrucciones del medicamento slo en las cajas y no directamente en los tubos del [place].   Si su medicamento es muy caro, por favor, pngase en contacto con Rolm Gala llamando al [phone] y presione la opcin 4 o envenos un mensaje a travs de Clinical cytogeneticist.   No podemos decirle cul ser su copago por los medicamentos por adelantado ya que esto es diferente dependiendo de la cobertura de su seguro. Sin embargo, es posible que podamos encontrar un medicamento sustituto a Audiological scientist un formulario para que el seguro cubra el medicamento que se considera necesario.   Si se requiere una autorizacin previa para que su compaa de seguros [place] su medicamento, por favor permtanos de 1 a 2 das hbiles para completar [address]  Los precios de los medicamentos varan con frecuencia dependiendo del Environmental consultant de  dnde se surte la receta y alguna farmacias pueden ofrecer precios ms baratos.  El sitio web www.goodrx.com tiene cupones para medicamentos de Health and safety inspector. Los precios aqu no tienen en cuenta lo que podra costar con la ayuda del seguro (puede ser ms barato con su seguro), pero el sitio web puede darle el precio si no utiliz Tourist information centre manager.  - Puede imprimir el cupn correspondiente y llevarlo con su receta a la farmacia.  - Tambin puede pasar por nuestra oficina durante el horario de atencin regular y Education officer, museum una tarjeta de cupones de GoodRx.  - Si necesita que su receta se enve electrnicamente a una farmacia diferente, informe a nuestra oficina a travs de MyChart de [place] o por telfono llamando al [phone] y presione la opcin 4.

## 2023-04-20 ENCOUNTER — Other Ambulatory Visit: Payer: Self-pay | Admitting: Dermatology

## 2023-04-20 DIAGNOSIS — T148XXA Other injury of unspecified body region, initial encounter: Secondary | ICD-10-CM

## 2023-05-01 ENCOUNTER — Ambulatory Visit: Payer: BC Managed Care – PPO

## 2023-05-01 DIAGNOSIS — L700 Acne vulgaris: Secondary | ICD-10-CM

## 2023-05-01 NOTE — Progress Notes (Signed)
Patient here today for a TheraClear session for acne vulgaris.    Treatment #3 Treatment Area: Neck and Face Energy: 3 Vacuum: 2 Pulse: Double Pulse Delay: 750   Photos taken and placed in media.  Patient understands and agrees he has not taken any forms of Isotretinoin within the last 4-6 months, he has not taken any Doxycycline or used topical Retin-A. Patient could not tolerate energy level at 4, decreased to level 3.       Dorathy Daft, RMA

## 2023-05-13 ENCOUNTER — Other Ambulatory Visit: Payer: Self-pay | Admitting: Dermatology

## 2023-05-16 ENCOUNTER — Ambulatory Visit: Payer: BC Managed Care – PPO

## 2023-05-16 DIAGNOSIS — L700 Acne vulgaris: Secondary | ICD-10-CM | POA: Diagnosis not present

## 2023-05-16 NOTE — Progress Notes (Signed)
Patient here today for a TheraClear session for acne vulgaris.    Treatment #4 Treatment Area: Neck and Face Energy: 3 Vacuum: 2 Pulse: Double Pulse Delay: 750   Photos taken and placed in media.  Patient understands and agrees he has not taken any forms of Isotretinoin within the last 4-6 months, he has not taken any Doxycycline or used topical Retin-A. Patient could not tolerate energy level at 4, decreased to level 3.       Dorathy Daft, RMA

## 2023-05-24 ENCOUNTER — Other Ambulatory Visit: Payer: Self-pay

## 2023-05-24 DIAGNOSIS — L700 Acne vulgaris: Secondary | ICD-10-CM

## 2023-05-24 DIAGNOSIS — L731 Pseudofolliculitis barbae: Secondary | ICD-10-CM

## 2023-05-24 MED ORDER — TRETINOIN 0.025 % EX CREA
TOPICAL_CREAM | CUTANEOUS | 2 refills | Status: DC
Start: 2023-05-24 — End: 2024-09-09

## 2023-05-24 NOTE — Progress Notes (Signed)
Per Dr. Onnie Boer previous office note, pt to start Tretinoin back but patient did not have RX at home.  Tretinoin resent. aw

## 2023-06-12 ENCOUNTER — Encounter: Payer: Self-pay | Admitting: Urology

## 2023-06-12 ENCOUNTER — Ambulatory Visit: Payer: BC Managed Care – PPO | Admitting: Urology

## 2023-06-12 VITALS — BP 129/89 | HR 103 | Ht 70.0 in | Wt 172.0 lb

## 2023-06-12 DIAGNOSIS — N3946 Mixed incontinence: Secondary | ICD-10-CM | POA: Diagnosis not present

## 2023-06-12 LAB — URINALYSIS, COMPLETE
Bilirubin, UA: NEGATIVE
Glucose, UA: NEGATIVE
Ketones, UA: NEGATIVE
Leukocytes,UA: NEGATIVE
Nitrite, UA: NEGATIVE
Protein,UA: NEGATIVE
RBC, UA: NEGATIVE
Specific Gravity, UA: <1.005 — ABNORMAL LOW (ref 1.005–1.030)
Urobilinogen, Ur: 0.2 mg/dL (ref 0.2–1.0)
pH, UA: 6 (ref 5.0–7.5)

## 2023-06-12 LAB — MICROSCOPIC EXAMINATION
Bacteria, UA: NONE SEEN
RBC, Urine: NONE SEEN /hpf (ref 0–2)

## 2023-06-12 MED ORDER — MIRABEGRON ER 25 MG PO TB24: 25.0000 mg | ORAL_TABLET | Freq: Every day | ORAL | 3 refills | Status: DC

## 2023-06-12 NOTE — Progress Notes (Signed)
06/12/2023 1:00 PM   Eric Vance [birth year] [id number]  Referring provider: Marisue Ivan, MD [id number] [facility] [address] [facility] [place],  [place] [address]  Chief Complaint  Patient presents with   Follow-up   Urinary Incontinence    1 year follow-up    HPI: Reviewed last note.  Saw patient July 2023 90x3 Myrbetriq sent to pharmacy. Hopefully he can continue to get the cheaper medication on the assistance program. Otherwise we will need to give him a lot of samples. I will reassess in a year   Today Seen incontinence much better and stable.  Frequency stable.  He is looking into infertility and had a testosterone of 108.  He was on Clomid but estrogen levels get too high.  He has a specialist that he seen.     PMH: Past Medical History:  Diagnosis Date   Arthritis    lower back   Hypertension    Lupus (HCC)    Seasonal allergies     Surgical History: Past Surgical History:  Procedure Laterality Date   MOUTH SURGERY     NASAL TURBINATE REDUCTION Bilateral 08/28/2020   Procedure: TURBINATE REDUCTION/SUBMUCOSAL RESECTION;  Surgeon: Linus Salmons, MD;  [facility];  Service: ENT;  Laterality: Bilateral;   SEPTOPLASTY Bilateral 08/28/2020   Procedure: SEPTOPLASTY;  Surgeon: Linus Salmons, MD;  Location: [facility];  Service: ENT;  Laterality: Bilateral;    Home Medications:  Allergies as of 06/12/2023       Reactions   Diclofenac Itching, Rash, Hives   Other reaction(s): Other (See Comments)        Medication List        Accurate as of June 12, 2023  1:00 PM. If you have any questions, ask your nurse or doctor.          clindamycin 1 % external solution Commonly known as: CLEOCIN T Apply to aa of face twice daily for folliculitis   DULoxetine 60 MG capsule Commonly known as: CYMBALTA Take 60 mg by mouth daily.   fexofenadine 180 MG tablet Commonly known as: ALLEGRA Take by mouth.   fluticasone 27.5  MCG/SPRAY nasal spray Commonly known as: VERAMYST Place into the nose.   hydrochlorothiazide 12.5 MG tablet Commonly known as: HYDRODIURIL Take 12.5 mg by mouth daily.   Ivermectin 1 % Crea Commonly known as: Soolantra Apply topically daily to face and neck   lamoTRIgine 150 MG tablet Commonly known as: LAMICTAL Take 150 mg by mouth daily.   minocycline 100 MG capsule Commonly known as: MINOCIN Take 1 capsule (100 mg total) by mouth 2 (two) times daily. Be good with sun protection. Take with food. Start after Armed forces technical officer treatments.   mirabegron ER 25 MG Tb24 tablet Commonly known as: MYRBETRIQ Take 1 tablet (25 mg total) by mouth daily.   montelukast 10 MG tablet Commonly known as: SINGULAIR Take 1 tablet by mouth daily.   MULTIVITAMIN MEN PO Take by mouth daily.   mupirocin ointment 2 % Commonly known as: BACTROBAN APPLY TOPICALLY TO WOUND AT SCALP 3 TIMES DAILY UNTIL HEALED   silver sulfADIAZINE 1 % cream Commonly known as: SILVADENE APPLY TOPICALLY TO THE AFFECTED AREA DAILY   sulfaSALAzine 500 MG EC tablet Commonly known as: AZULFIDINE Take 1,000 mg by mouth 2 (two) times daily.   tacrolimus 0.1 % ointment Commonly known as: PROTOPIC Apply topically 2 (two) times daily.   tadalafil 20 MG tablet Commonly known as: CIALIS Take by mouth.  tretinoin 0.025 % cream Commonly known as: RETIN-A Apply pea sized amount to face nightly for acne as tolerated.        Allergies:  Allergies  Allergen Reactions   Diclofenac Itching, Rash and Hives    Other reaction(s): Other (See Comments)    Family History: Family History  Problem Relation Age of Onset   Hypertension Father     Social History:  reports that he has never smoked. He has never been exposed to tobacco smoke. He has never used smokeless tobacco. He reports current alcohol use of about 8.0 standard drinks of alcohol per week. He reports that he does not use drugs.  ROS:                                         Physical Exam: There were no vitals taken for this visit.  Constitutional:  Alert and oriented, No acute distress. HEENT: [place] AT, moist mucus membranes.  Trachea midline, no masses.  Laboratory Data: Lab Results  Component Value Date   WBC 7.0 08/10/2021   HGB 15.0 08/10/2021   HCT 41.5 08/10/2021   MCV 93.3 08/10/2021   PLT 249 08/10/2021    Lab Results  Component Value Date   CREATININE 0.91 08/10/2021    No results found for: "PSA"  Lab Results  Component Value Date   TESTOSTERONE 322 04/24/2020    No results found for: "HGBA1C"  Urinalysis    Component Value Date/Time   APPEARANCEUR Clear 05/11/2020 1439   GLUCOSEU Negative 05/11/2020 1439   BILIRUBINUR Negative 05/11/2020 1439   PROTEINUR Negative 05/11/2020 1439   NITRITE Negative 05/11/2020 1439   LEUKOCYTESUR Negative 05/11/2020 1439    Pertinent Imaging:   Assessment & Plan: Myrbetriq 90 tablets and 3 refills sent to pharmacy.  I gave him my partners name Dr Francee Nodal if he ever needs another opinion about infertility  1. Mixed incontinence  - Urinalysis, Complete   No follow-ups on file.  Martina Sinner, MD  [facility] [address], [address] [place], [place] [address] [phone]

## 2023-06-13 ENCOUNTER — Encounter: Payer: Self-pay | Admitting: Dermatology

## 2023-07-10 ENCOUNTER — Encounter: Payer: Self-pay | Admitting: Dermatology

## 2023-07-10 ENCOUNTER — Ambulatory Visit: Payer: BC Managed Care – PPO | Admitting: Dermatology

## 2023-07-10 DIAGNOSIS — L280 Lichen simplex chronicus: Secondary | ICD-10-CM | POA: Diagnosis not present

## 2023-07-10 DIAGNOSIS — L281 Prurigo nodularis: Secondary | ICD-10-CM

## 2023-07-10 DIAGNOSIS — L731 Pseudofolliculitis barbae: Secondary | ICD-10-CM | POA: Diagnosis not present

## 2023-07-10 DIAGNOSIS — D229 Melanocytic nevi, unspecified: Secondary | ICD-10-CM

## 2023-07-10 MED ORDER — OPZELURA 1.5 % EX CREA: TOPICAL_CREAM | CUTANEOUS | 2 refills | Status: DC

## 2023-07-10 MED ORDER — CLINDAMYCIN PHOS-BENZOYL PEROX 1.2-5 % EX GEL: CUTANEOUS | 2 refills | Status: AC

## 2023-07-10 NOTE — Patient Instructions (Addendum)
Start Opzelura once daily, if not approved can switch to tacrolimus 1-2 times daily. Start clindamycin-benzoyl peroxide once daily in the morning.  Continue tretinoin 0.025% at bedtime Discontinue minocycline Discontinue Soolantra Discontinue Stratacel  Due to recent changes in healthcare laws, you may see results of your pathology and/or laboratory studies on MyChart before the doctors have had a chance to review them. We understand that in some cases there may be results that are confusing or concerning to you. Please understand that not all results are received at the same time and often the doctors may need to interpret multiple results in order to provide you with the best plan of care or course of treatment. Therefore, we ask that you please give [place] 2 business days to thoroughly review all your results before contacting the office for clarification. Should we see a critical lab result, you will be contacted sooner.   If You Need Anything After Your Visit  If you have any questions or concerns for your doctor, please call our main line at [phone] and press option 4 to reach your doctor's medical assistant. If no one answers, please leave a voicemail as directed and we will return your call as soon as possible. Messages left after 4 pm will be answered the following business day.   You may also send [place] a message via MyChart. We typically respond to MyChart messages within 1-2 business days.  For prescription refills, please ask your pharmacy to contact our office. Our fax number is [phone].  If you have an urgent issue when the clinic is closed that cannot wait until the next business day, you can page your doctor at the number below.    Please note that while we do our best to be available for urgent issues outside of office hours, we are not available 24/7.   If you have an urgent issue and are unable to reach [place], you may choose to seek medical care at your doctor's office,  retail clinic, urgent care center, or emergency room.  If you have a medical emergency, please immediately call 911 or go to the emergency department.  Pager Numbers  - Dr. Gwen Pounds: [phone]  - Dr. Roseanne Reno: [phone]  - Dr. Katrinka Blazing: [phone]   In the event of inclement weather, please call our main line at [phone] for an update on the status of any delays or closures.  Dermatology Medication Tips: Please keep the boxes that topical medications come in in order to help keep track of the instructions about where and how to use these. Pharmacies typically print the medication instructions only on the boxes and not directly on the medication tubes.   If your medication is too expensive, please contact our office at [phone] option 4 or send [place] a message through MyChart.   We are unable to tell what your co-pay for medications will be in advance as this is different depending on your insurance coverage. However, we may be able to find a substitute medication at lower cost or fill out paperwork to get insurance to cover a needed medication.   If a prior authorization is required to get your medication covered by your insurance company, please allow [place] 1-2 business days to complete this process.  Drug prices often vary depending on where the prescription is filled and some pharmacies may offer cheaper prices.  The website www.goodrx.com contains coupons for medications through different pharmacies. The prices here do not account for what the cost may be with help  from insurance (it may be cheaper with your insurance), but the website can give you the price if you did not use any insurance.  - You can print the associated coupon and take it with your prescription to the pharmacy.  - You may also stop by our office during regular business hours and pick up a GoodRx coupon card.  - If you need your prescription sent electronically to a different pharmacy, notify our office  through [facility] or by phone at [phone] option 4.     Si Usted Necesita Algo Despus de Su Visita  Tambin puede enviarnos un mensaje a travs de Clinical cytogeneticist. Por lo general respondemos a los mensajes de MyChart en el transcurso de 1 a 2 das hbiles.  Para renovar recetas, por favor pida a su farmacia que se ponga en contacto con nuestra oficina. Annie Sable de fax es [place] [phone].  Si tiene un asunto urgente cuando la clnica est cerrada y que no puede esperar hasta el siguiente da hbil, puede llamar/localizar a su doctor(a) al nmero que aparece a continuacin.   Por favor, tenga en cuenta que aunque hacemos todo lo posible para estar disponibles para asuntos urgentes fuera del horario de [place], no estamos disponibles las 24 horas del da, los 7 [address]  [address] de la [place].   Si tiene un problema urgente y no puede comunicarse con nosotros, puede optar por buscar atencin mdica  en el consultorio de su doctor(a), en una clnica privada, en un centro de atencin urgente o en una sala de emergencias.  Si tiene Engineer, drilling, por favor llame inmediatamente al 911 o vaya a la sala de emergencias.  Nmeros de bper  - Dr. Gwen Pounds: [phone]  - Dra. Roseanne Reno: [phone]  - Dr. Katrinka Blazing: [phone]   En caso de inclemencias del tiempo, por favor llame a Lacy Duverney principal al [phone] para una actualizacin sobre el [place] de cualquier retraso o cierre.  Consejos para la medicacin en dermatologa: Por favor, guarde las cajas en las que vienen los medicamentos de uso tpico para ayudarle a seguir las instrucciones sobre dnde y cmo usarlos. Las farmacias generalmente imprimen las instrucciones del medicamento slo en las cajas y no directamente en los tubos del [place].   Si su medicamento es muy caro, por favor, pngase en contacto con Rolm Gala llamando al [phone] y presione la opcin 4 o envenos un mensaje a travs de Clinical cytogeneticist.   No  podemos decirle cul ser su copago por los medicamentos por adelantado ya que esto es diferente dependiendo de la cobertura de su seguro. Sin embargo, es posible que podamos encontrar un medicamento sustituto a Audiological scientist un formulario para que el seguro cubra el medicamento que se considera necesario.   Si se requiere una autorizacin previa para que su compaa de seguros [place] su medicamento, por favor permtanos de 1 a 2 das hbiles para completar [address]  Los precios de los medicamentos varan con frecuencia dependiendo del Environmental consultant de dnde se surte la receta y alguna farmacias pueden ofrecer precios ms baratos.  El sitio web www.goodrx.com tiene cupones para medicamentos de Health and safety inspector. Los precios aqu no tienen en cuenta lo que podra costar con la ayuda del seguro (puede ser ms barato con su seguro), pero el sitio web puede darle el precio si no utiliz Tourist information centre manager.  - Puede imprimir el cupn correspondiente y llevarlo con su receta a la farmacia.  - Tambin puede pasar por nuestra oficina durante el  horario de Visual merchandiser regular y Education officer, museum una tarjeta de cupones de GoodRx.  - Si necesita que su receta se enve electrnicamente a una farmacia diferente, informe a nuestra oficina a travs de MyChart de [place] o por telfono llamando al [phone] y presione la opcin 4.

## 2023-07-10 NOTE — Progress Notes (Addendum)
   Follow-Up Visit   Subjective  Eric Vance is a 37 y.o. male who presents for the following: acne, rosacea and pseudofolliculitis follow up. Currently taking minocycline 100 mg twice daily, topically using soolantra every day, tretinoin 0.025% at bedtime, Stratacel bid, tacrolimus bid and silvadene to inflamed areas. He has had 4 Thera Clear treatments but has not noticed any improvement. Patient does not use a regular razor to shave, uses a beard trimmer.   He previously failed isotretinoin (could not tolerate due to dry eye), doxycycline (inadequate response), a variety of topicals including tretinoin, clindamycin, PDT, blue light therapy   Patient noticed a few weeks ago many new freckles that came up and would like to address those today.   The patient has spots, moles and lesions to be evaluated, some may be new or changing and the patient may have concern these could be cancer.   The following portions of the chart were reviewed this encounter and updated as appropriate: medications, allergies, medical history  Review of Systems:  No other skin or systemic complaints except as noted in HPI or Assessment and Plan.  Objective  Well appearing patient in no apparent distress; mood and affect are within normal limits.   A focused examination was performed of the following areas: Face, legs, arms, chest and back  Relevant exam findings are noted in the Assessment and Plan.    Assessment & Plan   PSEUDOFOLLICULITIS with [facility] Exam: erythematous excoriated papules restricted to bearded skin on face and neck, some with induration and scar-like appearance  Chronic flaring not at patient goal  Treatment Plan: Exam more consistent with PFB over acne Start Opzelura once daily, if not approved can switch to tacrolimus 1-2 times daily. Opzelura samples Lot # [id number]  Exp: 02/2024 Start clindamycin-benzoyl peroxide once daily in the morning Continue tretinoin 0.025% at  bedtime Discontinue minocycline Discontinue Soolantra Discontinue Stratacel Avoid shaving hair close to surface of skin. Avoid trauma and picking; will exacerbate lesions  MELANOCYTIC NEVI Exam: Tan-brown and/or pink-flesh-colored symmetric macules and papules  Treatment Plan: Benign appearing on exam today. Recommend observation. Call clinic for new or changing moles. Recommend daily use of broad spectrum spf 30+ sunscreen to sun-exposed areas.     Return for 2-3 month psuedofolliculitis .  Anise Salvo, RMA, am acting as scribe for Elie Goody, MD .   Documentation: I have reviewed the above documentation for accuracy and completeness, and I agree with the above.  Elie Goody, MD

## 2023-08-17 ENCOUNTER — Encounter: Payer: Self-pay | Admitting: Urology

## 2023-08-18 ENCOUNTER — Ambulatory Visit: Payer: BC Managed Care – PPO | Admitting: Family Medicine

## 2023-08-18 ENCOUNTER — Encounter: Payer: Self-pay | Admitting: Family Medicine

## 2023-08-18 VITALS — BP 119/77 | HR 98 | Ht 70.0 in | Wt 168.4 lb

## 2023-08-18 DIAGNOSIS — M9901 Segmental and somatic dysfunction of cervical region: Secondary | ICD-10-CM | POA: Diagnosis not present

## 2023-08-18 DIAGNOSIS — M9909 Segmental and somatic dysfunction of abdomen and other regions: Secondary | ICD-10-CM

## 2023-08-18 DIAGNOSIS — M9908 Segmental and somatic dysfunction of rib cage: Secondary | ICD-10-CM

## 2023-08-18 DIAGNOSIS — M9902 Segmental and somatic dysfunction of thoracic region: Secondary | ICD-10-CM

## 2023-08-18 DIAGNOSIS — M9904 Segmental and somatic dysfunction of sacral region: Secondary | ICD-10-CM

## 2023-08-18 DIAGNOSIS — M9903 Segmental and somatic dysfunction of lumbar region: Secondary | ICD-10-CM

## 2023-08-18 DIAGNOSIS — M9900 Segmental and somatic dysfunction of head region: Secondary | ICD-10-CM

## 2023-08-18 DIAGNOSIS — M545 Low back pain, unspecified: Secondary | ICD-10-CM | POA: Diagnosis not present

## 2023-08-18 DIAGNOSIS — G8929 Other chronic pain: Secondary | ICD-10-CM | POA: Insufficient documentation

## 2023-08-18 DIAGNOSIS — M9905 Segmental and somatic dysfunction of pelvic region: Secondary | ICD-10-CM

## 2023-08-18 DIAGNOSIS — M9906 Segmental and somatic dysfunction of lower extremity: Secondary | ICD-10-CM

## 2023-08-18 NOTE — Progress Notes (Unsigned)
BP 119/77   Pulse 98   Ht 5\' 10"  (1.778 m)   Wt 168 lb 6.4 oz (76.4 kg)   SpO2 98%   BMI 24.16 kg/m    Subjective:    Patient ID: Eric Vance, male    DOB: 08-27-1986, 37 y.o.   MRN: [id number]  HPI: Eric Vance is a 37 y.o. male  No chief complaint on file.   Eric Vance is a 37yo male who presents today for evaluation and possible treatment with OMT for chronic low back pain as well as several other musculoskeletal issues.   Has a lot of arthritis and neuro issues in his spine. He notes that he has spondylosis in his upper back and neck. Pain in his low back on the L side. Both knees- mainly a connective tissue issues. Issues with neurve issues in his L elow into his his hand and his L leg along the outside of his leg. Low back is worse with lifting his son. Back is horrible all the time.  No car accidents. Has had some trauma to his knees in the past with running a marathon. Had a concussion in January of 2024. Feeling back to normal.   Active Ambulatory Problems    Diagnosis Date Noted   Reflex sympathetic dystrophy of lower limb 10/14/2010   KNEE PAIN, BILATERAL 07/14/2010   Pes planus 08/16/2010   ABNORMALITY OF GAIT 08/16/2010   Anxiety 08/01/2019   History of chronic pain 08/01/2019   History of gastritis 08/01/2019   Low vitamin D level 08/01/2019   Pain in joints of right hand 01/31/2018   Patellar tendinitis of right knee 06/13/2012   Patellar tendon rupture 04/12/2012   Right anterior knee pain 08/07/2014   Traumatic rupture of collateral ligament of right index finger 11/27/2015   Vitamin D deficiency 08/12/2014   Recurrent UTI 08/01/2019   Nocturia 08/01/2019   Other insomnia 09/18/2019   Chronic left-sided low back pain 08/18/2023   Resolved Ambulatory Problems    Diagnosis Date Noted   No Resolved Ambulatory Problems   Past Medical History:  Diagnosis Date   Arthritis    Dysplastic nevus 05/01/2019   Dysplastic nevus 04/12/2018   Dysplastic nevus  12/13/2017   Dysplastic nevus 04/28/2016   Dysplastic nevus 08/12/2008   Dysplastic nevus 08/12/2008   Dysplastic nevus 08/12/2008   Hypertension    Lupus    Seasonal allergies    Past Surgical History:  Procedure Laterality Date   MOUTH SURGERY     NASAL TURBINATE REDUCTION Bilateral 08/28/2020   Procedure: TURBINATE REDUCTION/SUBMUCOSAL RESECTION;  Surgeon: Linus Salmons, MD;  Location: [facility];  Service: ENT;  Laterality: Bilateral;   SEPTOPLASTY Bilateral 08/28/2020   Procedure: SEPTOPLASTY;  Surgeon: Linus Salmons, MD;  Location: [facility];  Service: ENT;  Laterality: Bilateral;   Outpatient Encounter Medications as of 08/18/2023  Medication Sig   celecoxib (CELEBREX) 100 MG capsule Take by mouth.   Clindamycin-Benzoyl Per, Refr, gel Apply twice daily to spot treat affected areas.   DULoxetine (CYMBALTA) 60 MG capsule Take 60 mg by mouth daily.   fexofenadine (ALLEGRA) 180 MG tablet Take by mouth.   fluticasone (VERAMYST) 27.5 MCG/SPRAY nasal spray Place into the nose.   hydrochlorothiazide (HYDRODIURIL) 12.5 MG tablet Take 12.5 mg by mouth daily.   lamoTRIgine (LAMICTAL) 150 MG tablet Take 150 mg by mouth daily.   mirabegron ER (MYRBETRIQ) 25 MG TB24 tablet Take 1 tablet (25 mg total) by mouth daily.   montelukast (  SINGULAIR) 10 MG tablet Take 1 tablet by mouth daily.   Multiple Vitamins-Minerals (MULTIVITAMIN MEN PO) Take by mouth daily.   mupirocin ointment (BACTROBAN) 2 % APPLY TOPICALLY TO WOUND AT SCALP 3 TIMES DAILY UNTIL HEALED   Ruxolitinib Phosphate (OPZELURA) 1.5 % CREA Once daily to aa   sulfaSALAzine (AZULFIDINE) 500 MG EC tablet Take 1,000 mg by mouth 2 (two) times daily.   tacrolimus (PROTOPIC) 0.1 % ointment Apply topically 2 (two) times daily.   tadalafil (CIALIS) 20 MG tablet Take by mouth.   tretinoin (RETIN-A) 0.025 % cream Apply pea sized amount to face nightly for acne as tolerated.   No facility-administered encounter  medications on file as of 08/18/2023.   Allergies  Allergen Reactions   Diclofenac Itching, Rash and Hives    Other reaction(s): Other (See Comments)   Social History   Socioeconomic History   Marital status: Married    Spouse name: Not on file   Number of children: Not on file   Years of education: Not on file   Highest education level: Not on file  Occupational History   Not on file  Tobacco Use   Smoking status: Never    Passive exposure: Never   Smokeless tobacco: Never  Vaping Use   Vaping status: Never Used  Substance and Sexual Activity   Alcohol use: Yes    Alcohol/week: 8.0 standard drinks of alcohol    Types: 8 Cans of beer per week    Comment: Socially    Drug use: No   Sexual activity: Yes    Birth control/protection: None  Other Topics Concern   Not on file  Social History Narrative   Not on file   Social Determinants of Health   Financial Resource Strain: Low Risk  (06/28/2023)   Received from [facility]   Overall Financial Resource Strain (CARDIA)    Difficulty of Paying Living Expenses: Not hard at all  Food Insecurity: No Food Insecurity (06/28/2023)   Received from [facility]   Hunger Vital Sign    Worried About Running Out of Food in the Last Year: Never true    Ran Out of Food in the Last Year: Never true  Transportation Needs: No Transportation Needs (06/28/2023)   Received from [facility]    In the past 12 months, has lack of transportation kept you from medical appointments or from getting medications?: No    Lack of Transportation (Non-Medical): No  Physical Activity: Not on file  Stress: Not on file  Social Connections: Unknown (03/14/2022)   Received from [facility], [facility]   Social Network    Social Network: Not on file   Family History  Problem Relation Age of Onset   Hypertension Father      Review of Systems  Constitutional: Negative.    Respiratory: Negative.    Cardiovascular: Negative.   Musculoskeletal:  Positive for arthralgias, back pain, gait problem, myalgias, neck pain and neck stiffness. Negative for joint swelling.  Skin: Negative.   Psychiatric/Behavioral: Negative.      Per HPI unless specifically indicated above     Objective:    BP 119/77   Pulse 98   Ht 5\' 10"  (1.778 m)   Wt 168 lb 6.4 oz (76.4 kg)   SpO2 98%   BMI 24.16 kg/m   Wt Readings from Last 3 Encounters:  08/18/23 168 lb 6.4 oz (76.4 kg)  06/12/23 172  lb (78 kg)  06/06/22 172 lb (78 kg)    Physical Exam Vitals and nursing note reviewed.  Constitutional:      General: He is not in acute distress.    Appearance: Normal appearance. He is not ill-appearing.  HENT:     Head: Normocephalic and atraumatic.     Right Ear: External ear normal.     Left Ear: External ear normal.     Nose: Nose normal.     Mouth/Throat:     Mouth: Mucous membranes are moist.     Pharynx: Oropharynx is clear.  Eyes:     Extraocular Movements: Extraocular movements intact.     Conjunctiva/sclera: Conjunctivae normal.     Pupils: Pupils are equal, round, and reactive to light.  Neck:     Vascular: No carotid bruit.  Cardiovascular:     Rate and Rhythm: Normal rate.     Pulses: Normal pulses.  Pulmonary:     Effort: Pulmonary effort is normal. No respiratory distress.  Abdominal:     General: Abdomen is flat. There is no distension.     Palpations: Abdomen is soft. There is no mass.     Tenderness: There is no abdominal tenderness. There is no right CVA tenderness, left CVA tenderness, guarding or rebound.     Hernia: No hernia is present.  Musculoskeletal:     Cervical back: No muscular tenderness.  Lymphadenopathy:     Cervical: No cervical adenopathy.  Skin:    General: Skin is warm and dry.     Capillary Refill: Capillary refill takes less than 2 seconds.     Coloration: Skin is not jaundiced or pale.     Findings: No bruising, erythema,  lesion or rash.  Neurological:     General: No focal deficit present.     Mental Status: He is alert. Mental status is at baseline.  Psychiatric:        Mood and Affect: Mood normal.        Behavior: Behavior normal.        Thought Content: Thought content normal.        Judgment: Judgment normal.    Musculoskeletal:  Exam found Decreased ROM, Tissue texture changes, Tenderness to palpation, and Asymmetry of patient's  head, neck, thorax, ribs, lumbar, pelvis, sacrum, lower extremity, and abdomen Osteopathic Structural Exam:   Head: OAESSR, posterior R temporal SBS with decreased movement, SBS SRR  Neck: C4ESRL, trap hypertonic on the L, Scalenes hypertonic on the L  Thorax: T5ESRL, trap hypertonic on the L, parascapular hypertonicity on the L  Ribs: Ribs 6-8 locked up on the L  Lumbar: QL hypertonic on the L, psoas hypertonic on the L, L3-5SRRL  Pelvis: Anterior L innominate  Sacrum: L on L torsion, SI joint restricted on the L  Lower Extremity: IT band hypertonic on the L  Abdomen: diaphragm hypertonic on the L, pelvic diaphragm hypertonic on the L with fasical strain into L hip and low back  Results for orders placed or performed in visit on 06/12/23  Microscopic Examination   Urine  Result Value Ref Range   WBC, UA 0-5 0 - 5 /hpf   RBC, Urine None seen 0 - 2 /hpf   Epithelial Cells (non renal) 0-10 0 - 10 /hpf   Bacteria, UA None seen None seen/Few  Urinalysis, Complete  Result Value Ref Range   Specific Gravity, UA <1.005 (L) 1.005 - 1.030   pH, UA 6.0 5.0 - 7.5  Color, UA Yellow Yellow   Appearance Ur Clear Clear   Leukocytes,UA Negative Negative   Protein,UA Negative Negative/Trace   Glucose, UA Negative Negative   Ketones, UA Negative Negative   RBC, UA Negative Negative   Bilirubin, UA Negative Negative   Urobilinogen, Ur 0.2 0.2 - 1.0 mg/dL   Nitrite, UA Negative Negative   Microscopic Examination See below:       Assessment & Plan:   Problem List Items  Addressed This Visit       Other   Chronic left-sided low back pain - Primary    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 Also likely complicated due to positional issues with carrying his son. He does have somatic dysfunction that is contributing to his symptoms. Treated today with good results as below. Call with any concerns.       Relevant Medications   celecoxib (CELEBREX) 100 MG capsule   Other Visit Diagnoses     Head region somatic dysfunction       Cervical segment dysfunction       Thoracic segment dysfunction       Somatic dysfunction of lumbar region       Somatic dysfunction of sacral region       Somatic dysfunction of pelvis region       Somatic dysfunction of lower extremities       Rib cage region somatic dysfunction       Segmental dysfunction of abdomen           After verbal consent was obtained, patient was treated today with osteopathic manipulative medicine to the regions of the head, neck, thorax, ribs, lumbar, pelvis, sacrum, abdomen, and lower extremity using the techniques of cranial, FPR, myofascial release, counterstrain, HVLA, and soft tissue. Areas of compensation relating to his primary pain source also treated. Patient tolerated the procedure well with fair objective and fair subjective improvement in symptoms. He left the room in good condition. He was advised to stay well hydrated and that he may have some soreness following the procedure. If not improving or worsening, he will call and come in. She will return for reevaluation  in 1-2 weeks.  Follow up plan: Return 1- 2 weeks for OMM eval.

## 2023-08-18 NOTE — Assessment & Plan Note (Signed)
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 Also likely complicated due to positional issues with carrying his son. He does have somatic dysfunction that is contributing to his symptoms. Treated today with good results as below. Call with any concerns.

## 2023-08-22 ENCOUNTER — Other Ambulatory Visit (INDEPENDENT_AMBULATORY_CARE_PROVIDER_SITE_OTHER): Payer: Self-pay | Admitting: Pediatric Genetics

## 2023-08-22 DIAGNOSIS — R269 Unspecified abnormalities of gait and mobility: Secondary | ICD-10-CM

## 2023-08-22 DIAGNOSIS — G8929 Other chronic pain: Secondary | ICD-10-CM

## 2023-08-22 DIAGNOSIS — S63410S Traumatic rupture of collateral ligament of right index finger at metacarpophalangeal and interphalangeal joint, sequela: Secondary | ICD-10-CM

## 2023-08-28 ENCOUNTER — Ambulatory Visit: Payer: BC Managed Care – PPO | Admitting: Family Medicine

## 2023-08-28 ENCOUNTER — Encounter: Payer: Self-pay | Admitting: Family Medicine

## 2023-08-28 VITALS — BP 126/87 | HR 99 | Wt 168.8 lb

## 2023-08-28 DIAGNOSIS — M9903 Segmental and somatic dysfunction of lumbar region: Secondary | ICD-10-CM

## 2023-08-28 DIAGNOSIS — M9906 Segmental and somatic dysfunction of lower extremity: Secondary | ICD-10-CM | POA: Diagnosis not present

## 2023-08-28 DIAGNOSIS — G8929 Other chronic pain: Secondary | ICD-10-CM

## 2023-08-28 DIAGNOSIS — M9909 Segmental and somatic dysfunction of abdomen and other regions: Secondary | ICD-10-CM

## 2023-08-28 DIAGNOSIS — M9908 Segmental and somatic dysfunction of rib cage: Secondary | ICD-10-CM | POA: Diagnosis not present

## 2023-08-28 DIAGNOSIS — M9904 Segmental and somatic dysfunction of sacral region: Secondary | ICD-10-CM

## 2023-08-28 DIAGNOSIS — M9905 Segmental and somatic dysfunction of pelvic region: Secondary | ICD-10-CM

## 2023-08-28 DIAGNOSIS — M545 Low back pain, unspecified: Secondary | ICD-10-CM | POA: Diagnosis not present

## 2023-08-28 NOTE — Progress Notes (Signed)
BP 126/87   Pulse 99   Wt 168 lb 12.8 oz (76.6 kg)   SpO2 97%   BMI 24.22 kg/m    Subjective:    Patient ID: Eric Vance, male    DOB: June 22, 1986, 36 y.o.   MRN: [id number]  HPI: Eric Vance is a 37 y.o. male  Chief Complaint  Patient presents with   Back Pain   [name] presents today for evaluation and possible treatment with OMT for low back pain. He notes that he did worse following his last appointment. He notes that a few hours after last visit he started with severe pain in his L lower back that came on pretty suddenly. From last visit he notes that it has slowly increased. Pain is currently about a 6-7/10 right now. Pain is more sharp. No radiation today- he notes that it has been off and on down his leg. Pain is worse with some exercise and carrying his son. Better with rest. He does note that he went to the zoo over the weekend and was doing more walking, carrying and pulling. He is otherwise feeling well with no other concerns or complaints at this time.   Relevant past medical, surgical, family and social history reviewed and updated as indicated. Interim medical history since our last visit reviewed. Allergies and medications reviewed and updated.  Review of Systems  Constitutional: Negative.   Respiratory: Negative.    Cardiovascular: Negative.   Gastrointestinal: Negative.   Musculoskeletal:  Positive for arthralgias, back pain, myalgias, neck pain and neck stiffness. Negative for gait problem and joint swelling.  Skin: Negative.   Neurological: Negative.   Psychiatric/Behavioral: Negative.      Per HPI unless specifically indicated above     Objective:    BP 126/87   Pulse 99   Wt 168 lb 12.8 oz (76.6 kg)   SpO2 97%   BMI 24.22 kg/m   Wt Readings from Last 3 Encounters:  08/28/23 168 lb 12.8 oz (76.6 kg)  08/18/23 168 lb 6.4 oz (76.4 kg)  06/12/23 172 lb (78 kg)    Physical Exam Vitals and nursing note reviewed.  Constitutional:      General: He  is not in acute distress.    Appearance: Normal appearance. He is not ill-appearing.  HENT:     Head: Normocephalic and atraumatic.     Right Ear: External ear normal.     Left Ear: External ear normal.     Nose: Nose normal.     Mouth/Throat:     Mouth: Mucous membranes are moist.     Pharynx: Oropharynx is clear.  Eyes:     Extraocular Movements: Extraocular movements intact.     Conjunctiva/sclera: Conjunctivae normal.     Pupils: Pupils are equal, round, and reactive to light.  Neck:     Vascular: No carotid bruit.  Cardiovascular:     Rate and Rhythm: Normal rate.     Pulses: Normal pulses.  Pulmonary:     Effort: Pulmonary effort is normal. No respiratory distress.  Abdominal:     General: Abdomen is flat. There is no distension.     Palpations: Abdomen is soft. There is no mass.     Tenderness: There is no abdominal tenderness. There is no right CVA tenderness, left CVA tenderness, guarding or rebound.     Hernia: No hernia is present.  Musculoskeletal:     Cervical back: No muscular tenderness.  Lymphadenopathy:     Cervical: No cervical adenopathy.  Skin:    General: Skin is warm and dry.     Capillary Refill: Capillary refill takes less than 2 seconds.     Coloration: Skin is not jaundiced or pale.     Findings: No bruising, erythema, lesion or rash.  Neurological:     General: No focal deficit present.     Mental Status: He is alert. Mental status is at baseline.  Psychiatric:        Mood and Affect: Mood normal.        Behavior: Behavior normal.        Thought Content: Thought content normal.        Judgment: Judgment normal.   Musculoskeletal:  Exam found Decreased ROM, Tissue texture changes, Tenderness to palpation, and Asymmetry of patient's  ribs, lumbar, pelvis, sacrum, lower extremity, and abdomen Osteopathic Structural Exam:   Ribs: Ribs 6-8 locked up on the L  Lumbar: QL and psoas hypertonic on the L with spasm, L3-5SLRR   Pelvis: anterior L  innominate  Sacrum: L on L torsion  Lower Extremity: fascial strain from L inner knee into L hip and L lumbar  Abdomen: diaphragm hypertonic bilaterally L>R with fascial strain into L hip and L lumbar   Results for orders placed or performed in visit on 06/12/23  Microscopic Examination   Urine  Result Value Ref Range   WBC, UA 0-5 0 - 5 /hpf   RBC, Urine None seen 0 - 2 /hpf   Epithelial Cells (non renal) 0-10 0 - 10 /hpf   Bacteria, UA None seen None seen/Few  Urinalysis, Complete  Result Value Ref Range   Specific Gravity, UA <1.005 (L) 1.005 - 1.030   pH, UA 6.0 5.0 - 7.5   Color, UA Yellow Yellow   Appearance Ur Clear Clear   Leukocytes,UA Negative Negative   Protein,UA Negative Negative/Trace   Glucose, UA Negative Negative   Ketones, UA Negative Negative   RBC, UA Negative Negative   Bilirubin, UA Negative Negative   Urobilinogen, Ur 0.2 0.2 - 1.0 mg/dL   Nitrite, UA Negative Negative   Microscopic Examination See below:       Assessment & Plan:   Problem List Items Addressed This Visit       Other   Chronic left-sided low back pain - Primary   Other Visit Diagnoses     Somatic dysfunction of lumbar region       Somatic dysfunction of sacral region       Somatic dysfunction of pelvis region       Somatic dysfunction of lower extremities       Rib cage region somatic dysfunction       Segmental dysfunction of abdomen         After verbal consent was obtained, patient was treated today with osteopathic manipulative medicine to the regions of the ribs, lumbar, pelvis, sacrum, abdomen, and upper extremity using the techniques of myofascial release, counterstrain, muscle energy, HVLA, and soft tissue. Areas of compensation relating to his primary pain source also treated. Patient tolerated the procedure well with good objective and good subjective improvement in symptoms. He left the room in good condition. He was advised to stay well hydrated and that he may have  some soreness following the procedure. If not improving or worsening, he will call and come in. He will return for reevaluation  in 1-2 weeks.    Follow up plan: Return 10 days.

## 2023-09-05 ENCOUNTER — Ambulatory Visit (INDEPENDENT_AMBULATORY_CARE_PROVIDER_SITE_OTHER): Payer: BC Managed Care – PPO | Admitting: Medical Genetics

## 2023-09-05 VITALS — BP 136/93 | HR 91 | Ht 70.0 in | Wt 168.4 lb

## 2023-09-05 DIAGNOSIS — M25541 Pain in joints of right hand: Secondary | ICD-10-CM

## 2023-09-05 DIAGNOSIS — Z87898 Personal history of other specified conditions: Secondary | ICD-10-CM | POA: Diagnosis not present

## 2023-09-05 DIAGNOSIS — S93519A Sprain of interphalangeal joint of unspecified toe(s), initial encounter: Secondary | ICD-10-CM | POA: Insufficient documentation

## 2023-09-05 DIAGNOSIS — I10 Essential (primary) hypertension: Secondary | ICD-10-CM

## 2023-09-05 DIAGNOSIS — R52 Pain, unspecified: Secondary | ICD-10-CM

## 2023-09-05 DIAGNOSIS — M25561 Pain in right knee: Secondary | ICD-10-CM | POA: Diagnosis not present

## 2023-09-05 DIAGNOSIS — M659 Unspecified synovitis and tenosynovitis, unspecified site: Secondary | ICD-10-CM

## 2023-09-05 NOTE — Progress Notes (Unsigned)
MEDICAL GENETICS NEW PATIENT EVALUATION  Patient name: Eric Vance DOB: January 26, 1986 Age: 37 y.o. MRN: [id number]  Referring Provider/Specialty: Marisue Ivan, MD; Olevia Perches, DO  Date of Evaluation: 09/05/2023 Chief Complaint/Reason for Referral: Multiple medical concerns  Assessment: We discussed with Eric Vance that his symptoms may likely be due to be an underlying autoimmune disorder that is not yet fully characterized. There is no specific genetic testing for autoimmune disorders, but there is testing for rare autoinflammatory/autoimmune disorders through [facility]. Eric Vance was interested in this testing being performed, and samples were obtained and sent to [facility] with results expected in 1 month. We will contact Eric Vance when the results are available. Eric Vance should otherwise continue follow up with her current providers per their recommendations, and activities as tolerated.  Eric Vance has some nonspecific connective tissue differences leading to his soft/stretchy skin and problems with his gum tissue, but he does not have features that would suggest a condition such as joint hypermobility spectrum disorder or Ehlers-Danlos syndrome. Genetic testing for the rare types of EDS is not recommended at this time.  Recommendations: Autoinflammatory/autoimmune panel through [facility] - results expected in 1 month. Continue follow up with current medical providers per their recommendations. Activities as tolerated.  Follow up in genetics clinic will be based on the results of the testing.   HPI: Eric Vance is a 37 y.o. assigned male at birth who presents today for an initial genetics evaluation for multiple medical concerns. He provided the history. This information, along with a review of pertinent records, labs, and radiology studies, is summarized below.  Eric Vance feels he was healthy until around 28-7 years of age, with worsening in his mid-20s. He sees more  physicians than others in his family due to his various symptoms. He has autoimmune disorders (rheumatoid arthritis, nonradiographic ankylosing spondylitis of the spine), but he also has some other connective tissue symptoms in his knees, shoulders, and elbows. His knees (one or both) become a 'splotchy red' and are painful. These symptoms occurred over a 10 year period. He was started on sulfasalazine with improvement in these symptoms. He may also take prednisone (about 2 times per year) with flares of his symptoms (reduced mobility) that do not respond to other medications. His ankles have become less flexible over the past year, and he has constant back pain. He receives an injection of Cimzia that has helped his symptoms somewhat. He is not supposed to pick up more than 10 lbs. He is not aware of a triggering event causing his symptoms, but more a slow build of issues over time. Nerve blocks were not helpful to reduce his pain.  - ENT: hearing ok, noise sensitivity that can cause a headache and light flashes; braces on teeth when younger due to abnormal position along with thin gums, had transfer of gum tissue - Cardiovascular: hypertension treated with HCTZ, some improvements noted - GI: some issues with spasms but generally ok and improved with GU medication Myrbetriq - GU: poor bladder control started about 4 years ago, improved with Myrbetriq, some spasms due to neurologic damage some of which cannot be reversed - Neuro: short-term memory loss thought to be related to stress, has follow up testing every 18 months to monitor the progression, he also has radiating nerve pain of his left elbow and hip - Musculoskeletal: high ANA but no other markers are present, had a diagnosis of lupus in the past but this has turned out not to be correct,  he is followed by Eric Vance through [facility] in [place], he has more fever in the past; he was more flexible in the past but did not consider himself double-jointed   - Skin: easy bruising, no stretch marks, no issues with scars, has dry skin - Psych: doing better related to his son's genetic diagnosis, has seen a therapist in the past  Past Medical History:  Diagnosis Date   Arthritis    lower back   Dysplastic nevus 05/01/2019   left mid back paraspinal 2.0 cm lat to spine, moderate atypia   Dysplastic nevus 04/12/2018   R medial proximal lower leg just below the knee, moderate atypia   Dysplastic nevus 12/13/2017   L upper back med to sup scapula, moderate atypia   Dysplastic nevus 04/28/2016   right UQA periumbilical, mild atypia   Dysplastic nevus 08/12/2008   mid to upper back spinal, moderate atypia   Dysplastic nevus 08/12/2008   left low back near waistline, 3.5 cm lat to spine, moderate atypia   Dysplastic nevus 08/12/2008   right lower back flank, moderate atypia   Hypertension    Seasonal allergies    Patient Active Problem List   Diagnosis Date Noted   Chronic left-sided low back pain 08/18/2023   Other insomnia 09/18/2019   Anxiety 08/01/2019   History of chronic pain 08/01/2019   History of gastritis 08/01/2019   Low vitamin D level 08/01/2019   Recurrent UTI 08/01/2019   Nocturia 08/01/2019   Pain in joints of right hand 01/31/2018   Traumatic rupture of collateral ligament of right index finger 11/27/2015   Vitamin D deficiency 08/12/2014   Right anterior knee pain 08/07/2014   Patellar tendinitis of right knee 06/13/2012   Patellar tendon rupture 04/12/2012   Reflex sympathetic dystrophy of lower limb 10/14/2010   Pes planus 08/16/2010   ABNORMALITY OF GAIT 08/16/2010   KNEE PAIN, BILATERAL 07/14/2010   Past Surgical History:  Past Surgical History:  Procedure Laterality Date   MOUTH SURGERY     NASAL TURBINATE REDUCTION Bilateral 08/28/2020   Procedure: TURBINATE REDUCTION/SUBMUCOSAL RESECTION;  Surgeon: Linus Salmons, MD;  Location: [facility];  Service: ENT;  Laterality: Bilateral;   SEPTOPLASTY  Bilateral 08/28/2020   Procedure: SEPTOPLASTY;  Surgeon: Linus Salmons, MD;  Location: [facility];  Service: ENT;  Laterality: Bilateral;   Medications: Current Outpatient Medications on File Prior to Visit  Medication Sig Dispense Refill   celecoxib (CELEBREX) 100 MG capsule Take by mouth.     Clindamycin-Benzoyl Per, Refr, gel Apply twice daily to spot treat affected areas. 45 g 2   DULoxetine (CYMBALTA) 60 MG capsule Take 60 mg by mouth daily.     fexofenadine (ALLEGRA) 180 MG tablet Take by mouth.     fluticasone (VERAMYST) 27.5 MCG/SPRAY nasal spray Place into the nose.     hydrochlorothiazide (HYDRODIURIL) 12.5 MG tablet Take 12.5 mg by mouth daily.     lamoTRIgine (LAMICTAL) 150 MG tablet Take 150 mg by mouth daily.     mirabegron ER (MYRBETRIQ) 25 MG TB24 tablet Take 1 tablet (25 mg total) by mouth daily. 90 tablet 3   montelukast (SINGULAIR) 10 MG tablet Take 1 tablet by mouth daily.     Multiple Vitamins-Minerals (MULTIVITAMIN MEN PO) Take by mouth daily.     mupirocin ointment (BACTROBAN) 2 % APPLY TOPICALLY TO WOUND AT SCALP 3 TIMES DAILY UNTIL HEALED 22 g 0   Ruxolitinib Phosphate (OPZELURA) 1.5 % CREA Once daily to aa  60 g 2   sulfaSALAzine (AZULFIDINE) 500 MG EC tablet Take 1,000 mg by mouth 2 (two) times daily.     tacrolimus (PROTOPIC) 0.1 % ointment Apply topically 2 (two) times daily. 30 g 1   tadalafil (CIALIS) 20 MG tablet Take by mouth.     tretinoin (RETIN-A) 0.025 % cream Apply pea sized amount to face nightly for acne as tolerated. 45 g 2   No current facility-administered medications on file prior to visit.   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, sports  Vitals: Weight: 168.4 lbs Height: 5'10"   Genetics Physical Exam:  Constitution: The patient is active and alert  Head: No abnormalities detected in: head, hairline, shape or size    Anterior fontanelle flat: not flat    Anterior fontanelle open: not open    Bitemporal narrowing: forehead not narrow    Frontal bossing: no frontal bossing    Macrocephaly: not macrocephalic    Microcephaly: not microcephalic    Plagiocephaly: not plagiocephalic  Face: No abnormalities detected in: face, midface or shape    Coarse facial features: no coarse facies    Midfacial hypoplasia: no midfacial hypoplasia  Eyes: No abnormalities detected in: eyes, eyebrows, irises, eyelashes, lids or pupils    Deep-set eyes: eyes not deep set    Downslanting palpebral fissure: no downslanting palpebral fissure    Epicanthus: no epicanthus inversus    Upslanting palpebral fissure: no upslanting palpebral fissure  Ears: No abnormalities detected in: ears    Low-set ears: ears not low set    Posteriorly rotated ears: ears not posteriorly rotated  Nose: No abnormalities detected in: nose, nasal bridge or nasal tip    Bulbous nasal tip: no prominent nasal tip    Columella below nares: no columella below nares    Depressed nasal bridge: no depressed nasal bridge    Flat nasal bridge: no flat nasal bridge    Hypoplastic alae nasi: nasal alae not underdeveloped     Upturned nasal tip: non-upturned nasal tip  Neck: No abnormalities detected in: neck    Cysts: no cysts    Pits: no pits in neck    Redundant nuchal skin: no redundant neck skin    Webbing: no webbed neck  Chest: No abnormalities detected in: chest, appearance, clavicles or scapulae    Inverted nipples: nipples not inverted    Pectus  excavatum: no pectus excavatum  Cardiac: No abnormalities detected in: cardiovascular system    Abnormal distal perfusion: normal distal perfusion    Irregular rate: heart rate regular    Irregular rhythm: regular rhythm    Murmur: no murmur  Lungs: No abnormalities detected in: pulmonary system, bilateral auscultation or effort  Abdomen: No abnormalities detected in: abdomen or appearance    Abnormal umbilicus: normal umbilicus    Diastasis recti: no diastasis recti    Distended abdomen: no distension    Hepatosplenomegaly: no hepatosplenomegaly    Umbilical hernia: no umbilical hernia  Neurological: No abnormalities detected in: neurological system, deep tendon reflexes, antigravity  movement of extremities, strength, facial movement or tone    Hypertonia: not hypertonic    Hypotonia: not hypotonic Hair, Nails, and Skin: (comments: Soft skin, increased elasticity, piezogenic papules, no striae, no abnormal scarring)  Extremities: (comments: Beighton 0/9, genu varus, normal foot arches)   [place] Haldeman-Englert, MD [facility]etics Date: 09/05/2023 Time: 1140   Total time spent: 70 minutes Time spent includes face to face and non-face to face care for the patient on the date of this encounter (history and physical, genetic counseling, coordination of care, data gathering and/or documentation as outlined)

## 2023-09-05 NOTE — Patient Instructions (Signed)
Autoinflammatory/autoimmune panel through [facility] - results expected in 1 month. Continue follow up with current medical providers per their recommendations. Activities as tolerated.   Follow up in genetics clinic will be based on the results of the testing.  Thank you for allowing [place] to be a part of your care. Please let us know if there is anything else you need from [place].  [facility] Team

## 2023-09-05 NOTE — Progress Notes (Unsigned)
GENETIC COUNSELING NEW PATIENT EVALUATION Patient name: Eric Vance DOB: Aug 19, 1986 Age: 37 y.o. MRN: [id number]  Referring Provider/Specialty: Marisue Ivan, MD; Olevia Perches, DO  Date of Evaluation: 09/05/2023 Chief Complaint/Reason for Referral: Multiple medical concerns   Brief Summary: Eric Vance is a 37 y.o. male who presents today for an initial genetics evaluation for multiple medical concerns including connective tissue and autoimmune concerns. He is unaccompanied at today's visit.  Prior genetic testing has not been performed.   Family History: See pedigree obtained during today's visit under History->Family->Pedigree.  The family history was notable for the following: Son, 2 yo, with CNOT3-related Neurodevelopmental Disorder.  This condition is de novo in his son and not inherited from his father.  Paternal Family History Father, 67 yo, with hypertension and bladder control issues which began in his 74s. Aunt, 55 yo, with breast cancer in her late 10s. Uncle, deceased at 73 yo, due to mental illness. His son, deceased at 35 yo, from a seizure disorder of unknown cause with onset at 70 yo. Grandfather, deceased at 62 yo, with hypertension and bladder control issues with onset in his 20s.  Maternal Family History Mother, 78 yo, with rheumatoid arthritis, particularly affecting her back. Uncle, 23 yo, unaffected. His daughter with an unknown intellectual disability syndrome. Grandmother, deceased at 69 yo, with breast cancer in her 63s and rheumatoid arthritis.  Mother's ethnicity: [place] Father's ethnicity: [place], [place] Consangunity: Denies   Prior Genetic testing: None  Genetic Counseling: Eric Vance is a 37 y.o. male with multiple medical concerns.  Eric Vance symptoms began in his 29s with recurrent knee pain.  His knees frequently swell, "lock up", and become very red with even mild activity.  He also experiences significant lower back  pain that limits his range of motion.  This has been attributed to non-radiographic ankylosing spondylitis of the spine. Eric Vance also deals with bladder and stool dysfunction thought to be related to some neurological damage that causes spasming. Eric Vance is followed by rheumatology and has previously had high ANA.  He presents to clinic today for further evaluation of his symptoms and consideration of genetic testing.  We discussed with Eric Vance that his symptoms are likely due to an underlying autoimmune condition that has not yet been identified.  However, given his personal medical history, it is reasonable to consider genetic testing for autoinflammatory conditions.  We discussed genetic testing with Eric Vance, specifically the [facility] Autoinflammatory and Autoimmune Syndromes Panel (156 genes).  This test analyzes genes that are associated with autoinflammatory and autoimmune conditions. Autoinflammatory disorders are characterized by episodic inflammation due to inappropriate activation of the innate immune response whereas autoimmune disorders are caused by defects in adaptive immunity leading to loss of self-tolerance ([facility], 2024). This test may provide more information regarding the cause of Eric Vance symptoms, potential prevention measures or changes to management, and risk to other family members.   This test was discussed with Eric Vance, including the benefits, limitations, types of results, information it may provide, expected cost, and timeline.  Eric Vance was agreeable with this plan and a buccal sample was collected.  Results typically takes 2-3 weeks to return, at which point we will reach out with more information.  Recommendations: [facility] Autoinflammatory and Autoimmune Syndromes Panel (156 genes). Continue follow-up with current healthcare providers as recommended.  Date: 09/06/2023 Time: 13:34 Total time spent: 67  Lambert Mody MS [facility] Certified Genetic  Counselor [place] [facility]  I have personally counseled  the patient/family, spending > 50% of total time on counseling and coordination of care as outlined.

## 2023-09-06 ENCOUNTER — Encounter: Payer: Self-pay | Admitting: Genetic Counselor

## 2023-09-11 ENCOUNTER — Ambulatory Visit: Payer: BC Managed Care – PPO | Admitting: Family Medicine

## 2023-09-11 VITALS — BP 114/68 | HR 87

## 2023-09-11 DIAGNOSIS — M545 Low back pain, unspecified: Secondary | ICD-10-CM

## 2023-09-11 DIAGNOSIS — M9904 Segmental and somatic dysfunction of sacral region: Secondary | ICD-10-CM

## 2023-09-11 DIAGNOSIS — M9909 Segmental and somatic dysfunction of abdomen and other regions: Secondary | ICD-10-CM

## 2023-09-11 DIAGNOSIS — M9903 Segmental and somatic dysfunction of lumbar region: Secondary | ICD-10-CM | POA: Diagnosis not present

## 2023-09-11 DIAGNOSIS — M9905 Segmental and somatic dysfunction of pelvic region: Secondary | ICD-10-CM

## 2023-09-11 DIAGNOSIS — M9901 Segmental and somatic dysfunction of cervical region: Secondary | ICD-10-CM | POA: Diagnosis not present

## 2023-09-11 DIAGNOSIS — M9902 Segmental and somatic dysfunction of thoracic region: Secondary | ICD-10-CM

## 2023-09-11 DIAGNOSIS — M9908 Segmental and somatic dysfunction of rib cage: Secondary | ICD-10-CM | POA: Diagnosis not present

## 2023-09-11 DIAGNOSIS — G8929 Other chronic pain: Secondary | ICD-10-CM

## 2023-09-11 NOTE — Assessment & Plan Note (Signed)
He does have somatic dysfunction that is contributing to his symptoms. Treated today with good results as below. Call with any concerns. Continue to monitor.

## 2023-09-11 NOTE — Progress Notes (Signed)
BP 114/68   Pulse 87   SpO2 98%    Subjective:    Patient ID: Eric Vance, male    DOB: 1986/03/24, 37 y.o.   MRN: [id number]  HPI: Eric Vance is a 37 y.o. male  Chief Complaint  Patient presents with   Back Pain   Eric Vance presents today for evaluation and possible treatment with OMT for low back pain. He notes that since he was here last time, he has not really noticed much of a difference. He notes that his pain is not better or worse. He has had so much going on that he has trouble knowing how helpful OMT has been he notes that his pain remains mainly in his L low back. He is still worse when lifting and carrying his son and being on his feet for any period of time. Still worse with changing positions.  Pain is aching and sore in nature. It will radiate into his L shoulder and into his low back. Better with not carrying his son and rest. No other concerns or complaints at this time.   Relevant past medical, surgical, family and social history reviewed and updated as indicated. Interim medical history since our last visit reviewed. Allergies and medications reviewed and updated.  Review of Systems  Constitutional: Negative.   Respiratory: Negative.    Cardiovascular: Negative.   Musculoskeletal:  Positive for arthralgias, back pain and myalgias. Negative for gait problem, joint swelling, neck pain and neck stiffness.  Skin: Negative.   Psychiatric/Behavioral: Negative.      Per HPI unless specifically indicated above     Objective:    BP 114/68   Pulse 87   SpO2 98%   Wt Readings from Last 3 Encounters:  09/05/23 168 lb 6.4 oz (76.4 kg)  08/28/23 168 lb 12.8 oz (76.6 kg)  08/18/23 168 lb 6.4 oz (76.4 kg)    Physical Exam Vitals and nursing note reviewed.  Constitutional:      General: He is not in acute distress.    Appearance: Normal appearance. He is not ill-appearing.  HENT:     Head: Normocephalic and atraumatic.     Right Ear: External ear normal.     Left  Ear: External ear normal.     Nose: Nose normal.     Mouth/Throat:     Mouth: Mucous membranes are moist.     Pharynx: Oropharynx is clear.  Eyes:     Extraocular Movements: Extraocular movements intact.     Conjunctiva/sclera: Conjunctivae normal.     Pupils: Pupils are equal, round, and reactive to light.  Neck:     Vascular: No carotid bruit.  Cardiovascular:     Rate and Rhythm: Normal rate.     Pulses: Normal pulses.  Pulmonary:     Effort: Pulmonary effort is normal. No respiratory distress.  Abdominal:     General: Abdomen is flat. There is no distension.     Palpations: Abdomen is soft. There is no mass.     Tenderness: There is no abdominal tenderness. There is no right CVA tenderness, left CVA tenderness, guarding or rebound.     Hernia: No hernia is present.  Musculoskeletal:     Cervical back: No muscular tenderness.  Lymphadenopathy:     Cervical: No cervical adenopathy.  Skin:    General: Skin is warm and dry.     Capillary Refill: Capillary refill takes less than 2 seconds.     Coloration: Skin is not jaundiced or  pale.     Findings: No bruising, erythema, lesion or rash.  Neurological:     General: No focal deficit present.     Mental Status: He is alert. Mental status is at baseline.  Psychiatric:        Mood and Affect: Mood normal.        Behavior: Behavior normal.        Thought Content: Thought content normal.        Judgment: Judgment normal.   Musculoskeletal:  Exam found Decreased ROM, Tissue texture changes, Tenderness to palpation, and Asymmetry of patient's  neck, thorax, ribs, lumbar, pelvis, sacrum, and abdomen Osteopathic Structural Exam:   Neck: C4ESRL, hypertonic L scalenes  Thorax: T3-5SLRR  Ribs: Ribs 5-10 locked up on the L  Lumbar: QL hypertonic on the L, L3-5SRRL  Pelvis: Anterior L innominate  Sacrum: L on L torsion, SI joint restricted on the L  Abdomen: diaphragm hypertonic bilaterally, L>R   Results for orders placed or  performed in visit on 06/12/23  Microscopic Examination   Urine  Result Value Ref Range   WBC, UA 0-5 0 - 5 /hpf   RBC, Urine None seen 0 - 2 /hpf   Epithelial Cells (non renal) 0-10 0 - 10 /hpf   Bacteria, UA None seen None seen/Few  Urinalysis, Complete  Result Value Ref Range   Specific Gravity, UA <1.005 (L) 1.005 - 1.030   pH, UA 6.0 5.0 - 7.5   Color, UA Yellow Yellow   Appearance Ur Clear Clear   Leukocytes,UA Negative Negative   Protein,UA Negative Negative/Trace   Glucose, UA Negative Negative   Ketones, UA Negative Negative   RBC, UA Negative Negative   Bilirubin, UA Negative Negative   Urobilinogen, Ur 0.2 0.2 - 1.0 mg/dL   Nitrite, UA Negative Negative   Microscopic Examination See below:       Assessment & Plan:   Problem List Items Addressed This Visit       Other   Chronic left-sided low back pain - Primary    He does have somatic dysfunction that is contributing to his symptoms. Treated today with good results as below. Call with any concerns. Continue to monitor.      Other Visit Diagnoses     Somatic dysfunction of lumbar region       Somatic dysfunction of sacral region       Segmental dysfunction of abdomen       Somatic dysfunction of pelvis region       Rib cage region somatic dysfunction       Cervical segment dysfunction       Thoracic segment dysfunction          After verbal consent was obtained, patient was treated today with osteopathic manipulative medicine to the regions of the neck, thorax, ribs, lumbar, pelvis, sacrum, and abdomen using the techniques of cranial, FPR, myofascial release, counterstrain, muscle energy, HVLA, and soft tissue. Areas of compensation relating to his primary pain source also treated. Patient tolerated the procedure well with fair objective and good subjective improvement in symptoms. He left the room in good condition. He was advised to stay well hydrated and that he may have some soreness following the  procedure. If not improving or worsening, he will call and come in. He will return for reevaluation  in 1-2 weeks.   Follow up plan: Return if symptoms worsen or fail to improve.

## 2023-09-14 ENCOUNTER — Telehealth: Payer: Self-pay | Admitting: Genetic Counselor

## 2023-09-14 NOTE — Progress Notes (Signed)
Spoke with Mr. Segovia regarding the results of his genetic testing.  To review, Mr. 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  Genetic testing for rare autoimmune and autoinflammatory conditions was ordered.  The [facility] Autoinflammatory and Autoimmunity Panel (156 genes) was negative/normal.  At this time, we have not identified a genetic cause for Mr. Arras symptoms.  No changes to medical management or testing of other family members is recommended based on the results of this genetic testing.  He should continue follow-up with his current healthcare providers as indicated and recommended.  Mr. Plaza was encouraged to reach out with any further questions or major updates to his medical history. The test report has been released to him and is attached to the associated order.  Tilda Franco, MS [facility] Certified Genetic Counselor

## 2023-09-19 ENCOUNTER — Telehealth: Payer: Self-pay | Admitting: Genetic Counselor

## 2023-09-19 ENCOUNTER — Encounter: Payer: Self-pay | Admitting: Genetic Counselor

## 2023-09-19 NOTE — Progress Notes (Signed)
Spoke with Mr. Casebeer regarding an unexpected bill he received from recent genetic testing.  He will be sending me the bill he received via MyChart and I will be in communication with the lab.  I plan to call him back with any updates.  Tilda Franco, MS [facility] Certified Genetic Counselor

## 2023-09-22 ENCOUNTER — Other Ambulatory Visit: Payer: Self-pay | Admitting: Medical Genetics

## 2023-09-22 DIAGNOSIS — Z006 Encounter for examination for normal comparison and control in clinical research program: Secondary | ICD-10-CM

## 2023-09-27 ENCOUNTER — Other Ambulatory Visit
Admission: RE | Admit: 2023-09-27 | Discharge: 2023-09-27 | Disposition: A | Discharge status: Home / Self Care | Payer: BC Managed Care – PPO | Source: Ambulatory Visit | Attending: Medical Genetics | Admitting: Medical Genetics

## 2023-09-27 DIAGNOSIS — Z006 Encounter for examination for normal comparison and control in clinical research program: Secondary | ICD-10-CM | POA: Insufficient documentation

## 2023-09-29 ENCOUNTER — Encounter: Payer: Self-pay | Admitting: Family Medicine

## 2023-09-29 ENCOUNTER — Ambulatory Visit: Payer: BC Managed Care – PPO | Admitting: Family Medicine

## 2023-09-29 VITALS — BP 107/72 | Temp 98.1°F | Resp 16 | Ht 70.0 in | Wt 169.8 lb

## 2023-09-29 DIAGNOSIS — M9906 Segmental and somatic dysfunction of lower extremity: Secondary | ICD-10-CM

## 2023-09-29 DIAGNOSIS — M9903 Segmental and somatic dysfunction of lumbar region: Secondary | ICD-10-CM

## 2023-09-29 DIAGNOSIS — M9908 Segmental and somatic dysfunction of rib cage: Secondary | ICD-10-CM | POA: Diagnosis not present

## 2023-09-29 DIAGNOSIS — M9909 Segmental and somatic dysfunction of abdomen and other regions: Secondary | ICD-10-CM | POA: Diagnosis not present

## 2023-09-29 DIAGNOSIS — M545 Low back pain, unspecified: Secondary | ICD-10-CM | POA: Diagnosis not present

## 2023-09-29 DIAGNOSIS — M9902 Segmental and somatic dysfunction of thoracic region: Secondary | ICD-10-CM

## 2023-09-29 DIAGNOSIS — M9904 Segmental and somatic dysfunction of sacral region: Secondary | ICD-10-CM

## 2023-09-29 DIAGNOSIS — G8929 Other chronic pain: Secondary | ICD-10-CM

## 2023-09-29 DIAGNOSIS — M9905 Segmental and somatic dysfunction of pelvic region: Secondary | ICD-10-CM

## 2023-09-29 NOTE — Progress Notes (Unsigned)
   BP 107/72 (BP Location: Left Arm, Patient Position: Sitting, Cuff Size: Normal)   Temp 98.1 F (36.7 C) (Oral)   Resp 16   Ht 5\' 10"  (1.778 m)   Wt 169 lb 12.8 oz (77 kg)   SpO2 96%   BMI 24.36 kg/m    Subjective:    Patient ID: Eric Vance, male    DOB: 1986-03-15, 37 y.o.   MRN: [id number]  HPI: [name] is a 37 y.o. male  Chief Complaint  Patient presents with  . Procedure   He notes that his back is not doing well. They are talking about a total overhaul of his medication change. Not noticing that the OMM is helping at all. He notes that the last 3 days have been horrible. Has not done PT in about 6-8 months.   Relevant past medical, surgical, family and social history reviewed and updated as indicated. Interim medical history since our last visit reviewed. Allergies and medications reviewed and updated.  Review of Systems  Per HPI unless specifically indicated above     Objective:    BP 107/72 (BP Location: Left Arm, Patient Position: Sitting, Cuff Size: Normal)   Temp 98.1 F (36.7 C) (Oral)   Resp 16   Ht 5\' 10"  (1.778 m)   Wt 169 lb 12.8 oz (77 kg)   SpO2 96%   BMI 24.36 kg/m   Wt Readings from Last 3 Encounters:  09/29/23 169 lb 12.8 oz (77 kg)  09/05/23 168 lb 6.4 oz (76.4 kg)  08/28/23 168 lb 12.8 oz (76.6 kg)    Physical Exam  Results for orders placed or performed in visit on 06/12/23  Microscopic Examination   Urine  Result Value Ref Range   WBC, UA 0-5 0 - 5 /hpf   RBC, Urine None seen 0 - 2 /hpf   Epithelial Cells (non renal) 0-10 0 - 10 /hpf   Bacteria, UA None seen None seen/Few  Urinalysis, Complete  Result Value Ref Range   Specific Gravity, UA <1.005 (L) 1.005 - 1.030   pH, UA 6.0 5.0 - 7.5   Color, UA Yellow Yellow   Appearance Ur Clear Clear   Leukocytes,UA Negative Negative   Protein,UA Negative Negative/Trace   Glucose, UA Negative Negative   Ketones, UA Negative Negative   RBC, UA Negative Negative   Bilirubin, UA  Negative Negative   Urobilinogen, Ur 0.2 0.2 - 1.0 mg/dL   Nitrite, UA Negative Negative   Microscopic Examination See below:       Assessment & Plan:   Problem List Items Addressed This Visit   None    Follow up plan: No follow-ups on file.

## 2023-10-01 ENCOUNTER — Encounter: Payer: Self-pay | Admitting: Family Medicine

## 2023-10-01 NOTE — Assessment & Plan Note (Signed)
Has not had significant benefit from OMT, however, has been having issues with exacerbation of pain. Discussed getting baseline pain under better control and returning for further evaluation. We also discussed physical therapy to help with core strength. He will consider this and let us know if he'd like to start it. He does have somatic dysfunction that is contributing to his symptoms. Treated today with good results as below. Call with any concerns.

## 2023-10-02 ENCOUNTER — Ambulatory Visit: Payer: BC Managed Care – PPO | Admitting: Dermatology

## 2023-10-02 ENCOUNTER — Encounter: Payer: Self-pay | Admitting: Dermatology

## 2023-10-02 DIAGNOSIS — L731 Pseudofolliculitis barbae: Secondary | ICD-10-CM | POA: Diagnosis not present

## 2023-10-02 DIAGNOSIS — L280 Lichen simplex chronicus: Secondary | ICD-10-CM

## 2023-10-02 NOTE — Progress Notes (Signed)
   Follow-Up Visit   Subjective  Eric Vance is a 37 y.o. male who presents for the following: acne, rosacea and pseudofolliculitis follow up of the neck and beard area. Currently he is using Clindamycin/BP gel QAM, Tretinoin 0.025% cream QHS, and Opzelura sparingly. He has noticed an improvement in condition since his last visit.   He previously failed isotretinoin (could not tolerate due to dry eye), doxycycline (inadequate response), a variety of topicals including tretinoin, clindamycin, PDT, blue light therapy   The patient has spots, moles and lesions to be evaluated, some may be new or changing and the patient may have concern these could be cancer.   The following portions of the chart were reviewed this encounter and updated as appropriate: medications, allergies, medical history  Review of Systems:  No other skin or systemic complaints except as noted in HPI or Assessment and Plan.  Objective  Well appearing patient in no apparent distress; mood and affect are within normal limits.   A focused examination was performed of the following areas: Face, legs, arms, chest and back  Relevant exam findings are noted in the Assessment and Plan.    Assessment & Plan   PSEUDOFOLLICULITIS with [facility] Exam: postinflammatory erythematous macules on bilateral cheeks in beard area. Three inflamed excoriated papules on lower neck in beard area  Chronic and persistent condition with duration or expected duration over one year. Condition is improving with treatment but not currently at goal.  Treatment Plan:  Continue Clindamycin/BP gel QAM to flaring lesions and Tretinoin 0.025% cream at bedtime for maintenance. Continue Opzelura cream to inflamed areas QD PRN, and once flattened you no longer need to use Opzelura.  Avoid shaving hair close to surface of skin. Avoid trauma and picking; will exacerbate lesions.   Pseudofolliculitis barbae  Related Medications tretinoin (RETIN-A) 0.025  % cream Apply pea sized amount to face nightly for acne as tolerated.  Lichen simplex chronicus  return sooner if not doing well  Return in about 6 months (around 03/31/2024) for PFB follow up.  Anise Salvo, RMA, am acting as scribe for Elie Goody, MD .   Documentation: I have reviewed the above documentation for accuracy and completeness, and I agree with the above.  Elie Goody, MD

## 2023-10-02 NOTE — Patient Instructions (Signed)

## 2023-10-07 LAB — HELIX MOLECULAR SCREEN: Genetic Analysis Overall Interpretation: NEGATIVE

## 2023-10-07 LAB — GENECONNECT MOLECULAR SCREEN

## 2024-03-20 ENCOUNTER — Ambulatory Visit: Payer: BC Managed Care – PPO | Admitting: Dermatology

## 2024-03-26 ENCOUNTER — Ambulatory Visit: Payer: BC Managed Care – PPO | Admitting: Dermatology

## 2024-03-26 ENCOUNTER — Encounter: Payer: Self-pay | Admitting: Dermatology

## 2024-03-26 DIAGNOSIS — L731 Pseudofolliculitis barbae: Secondary | ICD-10-CM

## 2024-03-26 DIAGNOSIS — T148XXA Other injury of unspecified body region, initial encounter: Secondary | ICD-10-CM

## 2024-03-26 DIAGNOSIS — L281 Prurigo nodularis: Secondary | ICD-10-CM | POA: Diagnosis not present

## 2024-03-26 DIAGNOSIS — L280 Lichen simplex chronicus: Secondary | ICD-10-CM | POA: Diagnosis not present

## 2024-03-26 MED ORDER — DOXYCYCLINE MONOHYDRATE 100 MG PO CAPS: 100.0000 mg | ORAL_CAPSULE | Freq: Two times a day (BID) | ORAL | 2 refills | Status: AC

## 2024-03-26 MED ORDER — TRETINOIN 0.05 % EX CREA: TOPICAL_CREAM | CUTANEOUS | 3 refills | Status: AC

## 2024-03-26 NOTE — Progress Notes (Signed)
   Follow-Up Visit   Subjective  Eric Vance is a 38 y.o. male who presents for the following: 6 month follow up on acne, rosacea and pseudofolliculitis follow up of the neck and beard area. Currently he is using Clindamycin /BP gel QAM, Tretinoin  0.025% cream at bedtime. Out of Opzelura  samples. Using medications but not as consistently.   He states things are going relatively well, better than last appointment. Tolerating tretinoin  well.   He previously failed isotretinoin (could not tolerate due to dry eye), doxycycline  (inadequate response), a variety of topicals including tretinoin , clindamycin , PDT, blue light therapy    The following portions of the chart were reviewed this encounter and updated as appropriate: medications, allergies, medical history  Review of Systems:  No other skin or systemic complaints except as noted in HPI or Assessment and Plan.  Objective  Well appearing patient in no apparent distress; mood and affect are within normal limits.   A focused examination was performed of the following areas: Face, legs, arms, chest and back  Relevant exam findings are noted in the Assessment and Plan.     Assessment & Plan   PSEUDOFOLLICULITIS with prurigo nodularis/LSC Exam: inflamed papule on left chin. Indurated scaly papule on anterior neck on edge of beard hair line. Crusted papules on nasal bridge  Chronic and persistent condition with duration or expected duration over one year. Condition is improving with treatment but not currently at goal.  Treatment Plan: Tried opzelura  Start Doxycycline  100 mg 1 capsule twice a day for 2 weeks as needed for flares.  Continue Clindamycin /BP gel QAM to flaring lesions. Increase strength to Tretinoin  0.05% cream at bedtime for maintenance. Dilute with cream if irritating Avoid shaving hair close to surface of skin. Avoid trauma and picking; will exacerbate lesions.  RTC if not controlled  Doxycycline  should be taken with  food to prevent nausea. Do not lay down for 30 minutes after taking. Be cautious with sun exposure and use good sun protection while on this medication. Pregnant women should not take this medication.    PSEUDOFOLLICULITIS BARBAE   Related Medications tretinoin  (RETIN-A ) 0.025 % cream Apply pea sized amount to face nightly for acne as tolerated. doxycycline  (MONODOX ) 100 MG capsule Take 1 capsule (100 mg total) by mouth 2 (two) times daily. Take with food and drink tretinoin  (RETIN-A ) 0.05 % cream Apply pea-sized amount at bedtime, wash off in morning. LICHEN SIMPLEX CHRONICUS   Related Medications tretinoin  (RETIN-A ) 0.05 % cream Apply pea-sized amount at bedtime, wash off in morning. PRURIGO NODULARIS   Related Medications tretinoin  (RETIN-A ) 0.05 % cream Apply pea-sized amount at bedtime, wash off in morning. EXCORIATION     Return in about 1 year (around 03/26/2025) for pseudofolliculitis follow up .  I, Jill Parcell, CMA, am acting as scribe for Harris Liming, MD.    Documentation: I have reviewed the above documentation for accuracy and completeness, and I agree with the above.  Harris Liming, MD

## 2024-03-26 NOTE — Patient Instructions (Signed)
 Start Doxycycline  100 mg 1 capsule twice a day for 2 weeks as needed for flares.  Continue Clindamycin /BP gel QAM to flaring lesions. Increase to Tretinoin  0.05% cream at bedtime for maintenance.  Avoid shaving hair close to surface of skin. Avoid trauma and picking; will exacerbate lesions.   Doxycycline  should be taken with food to prevent nausea. Do not lay down for 30 minutes after taking. Be cautious with sun exposure and use good sun protection while on this medication. Pregnant women should not take this medication.     Recommend daily broad spectrum sunscreen SPF 30+ to sun-exposed areas, reapply every 2 hours as needed. Call for new or changing lesions.  Staying in the shade or wearing long sleeves, sun glasses (UVA+UVB protection) and wide brim hats (4-inch brim around the entire circumference of the hat) are also recommended for sun protection.     Due to recent changes in healthcare laws, you may see results of your pathology and/or laboratory studies on MyChart before the doctors have had a chance to review them. We understand that in some cases there may be results that are confusing or concerning to you. Please understand that not all results are received at the same time and often the doctors may need to interpret multiple results in order to provide you with the best plan of care or course of treatment. Therefore, we ask that you please give us  2 business days to thoroughly review all your results before contacting the office for clarification. Should we see a critical lab result, you will be contacted sooner.   If You Need Anything After Your Visit  If you have any questions or concerns for your doctor, please call our main line at [phone] and press option 4 to reach your doctor's medical assistant. If no one answers, please leave a voicemail as directed and we will return your call as soon as possible. Messages left after 4 pm will be answered the following business day.    You may also send us  a message via MyChart. We typically respond to MyChart messages within 1-2 business days.  For prescription refills, please ask your pharmacy to contact our office. Our fax number is [phone].  If you have an urgent issue when the clinic is closed that cannot wait until the next business day, you can page your doctor at the number below.    Please note that while we do our best to be available for urgent issues outside of office hours, we are not available 24/7.   If you have an urgent issue and are unable to reach us , you may choose to seek medical care at your doctor's office, retail clinic, urgent care center, or emergency room.  If you have a medical emergency, please immediately call 911 or go to the emergency department.  Pager Numbers  - Dr. Bary Likes: [phone]  - Dr. Annette Barters: [phone]  - Dr. Felipe Horton: [phone]   In the event of inclement weather, please call our main line at [phone] for an update on the status of any delays or closures.  Dermatology Medication Tips: Please keep the boxes that topical medications come in in order to help keep track of the instructions about where and how to use these. Pharmacies typically print the medication instructions only on the boxes and not directly on the medication tubes.   If your medication is too expensive, please contact our office at [phone] option 4 or send us  a message through MyChart.   We are  unable to tell what your co-pay for medications will be in advance as this is different depending on your insurance coverage. However, we may be able to find a substitute medication at lower cost or fill out paperwork to get insurance to cover a needed medication.   If a prior authorization is required to get your medication covered by your insurance company, please allow us  1-2 business days to complete this process.  Drug prices often vary depending on where the prescription is filled and  some pharmacies may offer cheaper prices.  The website www.goodrx.com contains coupons for medications through different pharmacies. The prices here do not account for what the cost may be with help from insurance (it may be cheaper with your insurance), but the website can give you the price if you did not use any insurance.  - You can print the associated coupon and take it with your prescription to the pharmacy.  - You may also stop by our office during regular business hours and pick up a GoodRx coupon card.  - If you need your prescription sent electronically to a different pharmacy, notify our office through [facility] or by phone at [phone] option 4.     Si Usted Necesita Algo Despus de Su Visita  Tambin puede enviarnos un mensaje a travs de Clinical cytogeneticist. Por lo general respondemos a los mensajes de MyChart en el transcurso de 1 a 2 das hbiles.  Para renovar recetas, por favor pida a su farmacia que se ponga en contacto con nuestra oficina. Franz Jacks de fax es [place] [phone].  Si tiene un asunto urgente cuando la clnica est cerrada y que no puede esperar hasta el siguiente da hbil, puede llamar/localizar a su doctor(a) al nmero que aparece a continuacin.   Por favor, tenga en cuenta que aunque hacemos todo lo posible para estar disponibles para asuntos urgentes fuera del horario de [employer], no estamos disponibles las 24 horas del da, los 7 [address]  [address] de la [place].   Si tiene un problema urgente y no puede comunicarse con nosotros, puede optar por buscar atencin mdica  en el consultorio de su doctor(a), en una clnica privada, en un centro de atencin urgente o en una sala de emergencias.  Si tiene Engineer, drilling, por favor llame inmediatamente al 911 o vaya a la sala de emergencias.  Nmeros de bper  - Dr. Bary Likes: [phone]  - Dra. Annette Barters: [phone]  - Dr. Felipe Horton: [phone]   En caso de inclemencias del tiempo, por favor llame a Lajuan Pila principal al [phone] para una actualizacin sobre el [place] de cualquier retraso o cierre.  Consejos para la medicacin en dermatologa: Por favor, guarde las cajas en las que vienen los medicamentos de uso tpico para ayudarle a seguir las instrucciones sobre dnde y cmo usarlos. Las farmacias generalmente imprimen las instrucciones del medicamento slo en las cajas y no directamente en los tubos del [place].   Si su medicamento es muy caro, por favor, pngase en contacto con Bettyjane Brunet llamando al [phone] y presione la opcin 4 o envenos un mensaje a travs de Clinical cytogeneticist.   No podemos decirle cul ser su copago por los medicamentos por adelantado ya que esto es diferente dependiendo de la cobertura de su seguro. Sin embargo, es posible que podamos encontrar un medicamento sustituto a Audiological scientist un formulario para que el seguro cubra el medicamento que se considera necesario.   Si se requiere una autorizacin previa para que su compaa  de seguros [place] su medicamento, por favor permtanos de 1 a 2 das hbiles para completar [address]  Los precios de los medicamentos varan con frecuencia dependiendo del Environmental consultant de dnde se surte la receta y alguna farmacias pueden ofrecer precios ms baratos.  El sitio web www.goodrx.com tiene cupones para medicamentos de Health and safety inspector. Los precios aqu no tienen en cuenta lo que podra costar con la ayuda del seguro (puede ser ms barato con su seguro), pero el sitio web puede darle el precio si no utiliz Tourist information centre manager.  - Puede imprimir el cupn correspondiente y llevarlo con su receta a la farmacia.  - Tambin puede pasar por nuestra oficina durante el horario de atencin regular y Education officer, museum una tarjeta de cupones de GoodRx.  - Si necesita que su receta se enve electrnicamente a una farmacia diferente, informe a nuestra oficina a travs de MyChart de [place] o por telfono llamando al [phone] y presione la  opcin 4.

## 2024-05-27 ENCOUNTER — Telehealth: Payer: Self-pay

## 2024-05-27 ENCOUNTER — Telehealth: Payer: Self-pay | Admitting: Urology

## 2024-05-27 DIAGNOSIS — N3946 Mixed incontinence: Secondary | ICD-10-CM

## 2024-05-27 NOTE — Telephone Encounter (Signed)
 Called Patient to get rescheduled no answer left a voice mail to call office back and to recheduled

## 2024-05-27 NOTE — Telephone Encounter (Signed)
 Patient was called to reschedule 7/28 appointment due to provider not being in office that afternoon. Patient is now scheduled for 9/29 but would like to know if he can have a RX refill sent to pharmacy for Myrbetriq  to last him until his appointment in September. Patient states he will be out of medication around mid July. Please advise patient.

## 2024-05-28 MED ORDER — MIRABEGRON ER 25 MG PO TB24: 25.0000 mg | ORAL_TABLET | Freq: Every day | ORAL | 0 refills | Status: DC

## 2024-05-28 NOTE — Telephone Encounter (Signed)
 Informed pt that we are sending refills. Pt voiced understanding.

## 2024-06-10 ENCOUNTER — Ambulatory Visit: Payer: Self-pay | Admitting: Urology

## 2024-08-12 ENCOUNTER — Ambulatory Visit: Admitting: Urology

## 2024-09-09 ENCOUNTER — Ambulatory Visit (INDEPENDENT_AMBULATORY_CARE_PROVIDER_SITE_OTHER): Admitting: Urology

## 2024-09-09 VITALS — BP 136/89 | HR 80 | Ht 70.0 in | Wt 172.0 lb

## 2024-09-09 DIAGNOSIS — N3946 Mixed incontinence: Secondary | ICD-10-CM | POA: Diagnosis not present

## 2024-09-09 LAB — MICROSCOPIC EXAMINATION: Bacteria, UA: NONE SEEN

## 2024-09-09 LAB — URINALYSIS, COMPLETE
Bilirubin, UA: NEGATIVE
Glucose, UA: NEGATIVE
Ketones, UA: NEGATIVE
Leukocytes,UA: NEGATIVE
Nitrite, UA: NEGATIVE
Protein,UA: NEGATIVE
RBC, UA: NEGATIVE
Specific Gravity, UA: 1.015 (ref 1.005–1.030)
Urobilinogen, Ur: 0.2 mg/dL (ref 0.2–1.0)
pH, UA: 6.5 (ref 5.0–7.5)

## 2024-09-09 MED ORDER — MIRABEGRON ER 25 MG PO TB24: 25.0000 mg | ORAL_TABLET | Freq: Every day | ORAL | 3 refills | Status: AC

## 2024-09-09 NOTE — Progress Notes (Signed)
 09/09/2024 3:30 PM   Eric Vance [birth year] [id number]  Referring provider: Alla Amis, MD [id number] [facility] [address] [facility] [place],  [place] [address]  No chief complaint on file.   HPI: Reviewed last note.  Saw patient July 2023 90x3 Myrbetriq  sent to pharmacy. Hopefully he can continue to get the cheaper medication on the assistance program. Otherwise we will need to give him a lot of samples. I will reassess in a year    Today Seen incontinence much better and stable.  Frequency stable.  He is looking into infertility and had a testosterone of 108.  He was on Clomid but estrogen levels get too high.  He has a specialist that he seen.Myrbetriq  90 tablets and 3 refills sent to pharmacy.  I gave him my partners name Dr Lovie Morita if he ever needs another opinion about infertility   Today Symptoms are a little bit worse but overall doing well.  Sometimes has a lot of frequency.  Sometimes has infrequent urination.  No infections.  Fluid is stable   PMH: Past Medical History:  Diagnosis Date   Arthritis    lower back   Dysplastic nevus 05/01/2019   left mid back paraspinal 2.0 cm lat to spine, moderate atypia   Dysplastic nevus 04/12/2018   R medial proximal lower leg just below the knee, moderate atypia   Dysplastic nevus 12/13/2017   L upper back med to sup scapula, moderate atypia   Dysplastic nevus 04/28/2016   right UQA periumbilical, mild atypia   Dysplastic nevus 08/12/2008   mid to upper back spinal, moderate atypia   Dysplastic nevus 08/12/2008   left low back near waistline, 3.5 cm lat to spine, moderate atypia   Dysplastic nevus 08/12/2008   right lower back flank, moderate atypia   Hypertension    Lupus    Seasonal allergies     Surgical History: Past Surgical History:  Procedure Laterality Date   MOUTH SURGERY     NASAL TURBINATE REDUCTION Bilateral 08/28/2020   Procedure: TURBINATE REDUCTION/SUBMUCOSAL RESECTION;  Surgeon:  Herminio Miu, MD;  Location: [facility];  Service: ENT;  Laterality: Bilateral;   SEPTOPLASTY Bilateral 08/28/2020   Procedure: SEPTOPLASTY;  Surgeon: Herminio Miu, MD;  Location: [facility];  Service: ENT;  Laterality: Bilateral;    Home Medications:  Allergies as of 09/09/2024       Reactions   Diclofenac Itching, Rash, Hives   Other reaction(s): Other (See Comments)        Medication List        Accurate as of September 09, 2024  3:30 PM. If you have any questions, ask your nurse or doctor.          celecoxib 100 MG capsule Commonly known as: CELEBREX Take by mouth.   Clindamycin -Benzoyl Per (Refr) gel Apply twice daily to spot treat affected areas.   doxycycline  100 MG capsule Commonly known as: MONODOX  Take 1 capsule (100 mg total) by mouth 2 (two) times daily. Take with food and drink   DULoxetine 60 MG capsule Commonly known as: CYMBALTA Take 60 mg by mouth daily.   fexofenadine 180 MG tablet Commonly known as: ALLEGRA Take by mouth.   fluticasone 27.5 MCG/SPRAY nasal spray Commonly known as: VERAMYST Place into the nose.   hydrochlorothiazide 12.5 MG tablet Commonly known as: HYDRODIURIL Take 12.5 mg by mouth daily.   lamoTRIgine 150 MG tablet Commonly known as: LAMICTAL Take 150 mg by mouth daily.   mirabegron  ER  25 MG Tb24 tablet Commonly known as: MYRBETRIQ  Take 1 tablet (25 mg total) by mouth daily.   montelukast 10 MG tablet Commonly known as: SINGULAIR Take 1 tablet by mouth daily.   MULTIVITAMIN MEN PO Take by mouth daily.   sulfaSALAzine 500 MG EC tablet Commonly known as: AZULFIDINE Take 1,000 mg by mouth 2 (two) times daily.   tadalafil  20 MG tablet Commonly known as: CIALIS  Take by mouth.   tretinoin  0.025 % cream Commonly known as: RETIN-A  Apply pea sized amount to face nightly for acne as tolerated.   tretinoin  0.05 % cream Commonly known as: RETIN-A  Apply pea-sized amount at bedtime, wash  off in morning.        Allergies:  Allergies  Allergen Reactions   Diclofenac Itching, Rash and Hives    Other reaction(s): Other (See Comments)    Family History: Family History  Problem Relation Age of Onset   GU problems Father 50       loss of bladder control   Hypertension Father    Rheum arthritis Mother    Osteoporosis Maternal Grandmother    Breast cancer Maternal Grandmother 3   Rheum arthritis Maternal Grandmother    GU problems Paternal Grandfather        loss of bladder control in 31s   Heart defect Maternal Uncle    Intellectual disability Other    Breast cancer Paternal Aunt        late 36s   Mental illness Paternal Uncle     Social History:  reports that he has never smoked. He has never been exposed to tobacco smoke. He has never used smokeless tobacco. He reports current alcohol use of about 8.0 standard drinks of alcohol per week. He reports that he does not use drugs.  ROS:                                        Physical Exam: There were no vitals taken for this visit.  Constitutional:  Alert and oriented, No acute distress. HEENT: [place] AT, moist mucus membranes.  Trachea midline, no masses.   Laboratory Data: Lab Results  Component Value Date   WBC 7.0 08/10/2021   HGB 15.0 08/10/2021   HCT 41.5 08/10/2021   MCV 93.3 08/10/2021   PLT 249 08/10/2021    Lab Results  Component Value Date   CREATININE 0.91 08/10/2021    No results found for: PSA  Lab Results  Component Value Date   TESTOSTERONE 322 04/24/2020    No results found for: HGBA1C  Urinalysis    Component Value Date/Time   APPEARANCEUR Clear 06/12/2023 1302   GLUCOSEU Negative 06/12/2023 1302   BILIRUBINUR Negative 06/12/2023 1302   PROTEINUR Negative 06/12/2023 1302   NITRITE Negative 06/12/2023 1302   LEUKOCYTESUR Negative 06/12/2023 1302    Pertinent Imaging:   Assessment & Plan: 90 x 3 sent to pharmacy.  I will send in the brand  that he will get what is cheaper.  See in 1  1. Mixed incontinence (Primary)    No follow-ups on file.  Glendia DELENA Elizabeth, MD  [facility] [address], [address] [place], [place] [address] [phone]

## 2024-09-09 NOTE — Addendum Note (Signed)
 Addended byBETHA CORIE PLATER on: 09/09/2024 05:08 PM   Modules accepted: Orders

## 2024-09-09 NOTE — Addendum Note (Signed)
 Addended by: Dianah Pruett A on: 09/09/2024 03:39 PM   Modules accepted: Orders

## 2025-04-01 ENCOUNTER — Ambulatory Visit: Admitting: Dermatology

## 2025-09-08 ENCOUNTER — Ambulatory Visit: Admitting: Urology
